# Patient Record
Sex: Female | Born: 1935 | Race: Black or African American | Hispanic: No | Smoking: Never smoker
Health system: Southern US, Community
[De-identification: ages and names within clinical notes are randomized; demographics above are authoritative.]

## PROBLEM LIST (undated history)

## (undated) DIAGNOSIS — K219 Gastro-esophageal reflux disease without esophagitis: Secondary | ICD-10-CM

## (undated) DIAGNOSIS — R569 Unspecified convulsions: Secondary | ICD-10-CM

## (undated) DIAGNOSIS — F419 Anxiety disorder, unspecified: Secondary | ICD-10-CM

## (undated) DIAGNOSIS — I10 Essential (primary) hypertension: Secondary | ICD-10-CM

## (undated) HISTORY — PX: FRACTURE SURGERY: SHX138

## (undated) HISTORY — PX: JOINT REPLACEMENT: SHX530

---

## 2003-02-21 ENCOUNTER — Inpatient Hospital Stay (HOSPITAL_COMMUNITY): Admission: EM | Admit: 2003-02-21 | Discharge: 2003-03-06 | Payer: Self-pay | Admitting: Orthopedic Surgery

## 2003-02-22 ENCOUNTER — Encounter: Payer: Self-pay | Admitting: Orthopedic Surgery

## 2003-02-26 ENCOUNTER — Encounter: Payer: Self-pay | Admitting: Orthopedic Surgery

## 2003-02-27 ENCOUNTER — Encounter: Payer: Self-pay | Admitting: Internal Medicine

## 2003-03-04 ENCOUNTER — Encounter: Payer: Self-pay | Admitting: Orthopedic Surgery

## 2005-03-21 ENCOUNTER — Inpatient Hospital Stay (HOSPITAL_COMMUNITY): Admission: EM | Admit: 2005-03-21 | Discharge: 2005-03-30 | Payer: Self-pay | Admitting: Emergency Medicine

## 2005-03-21 ENCOUNTER — Ambulatory Visit: Payer: Self-pay | Admitting: Physical Medicine & Rehabilitation

## 2006-12-05 ENCOUNTER — Ambulatory Visit: Payer: Self-pay | Admitting: Cardiovascular Disease

## 2006-12-05 ENCOUNTER — Inpatient Hospital Stay (HOSPITAL_COMMUNITY): Admission: EM | Admit: 2006-12-05 | Discharge: 2006-12-16 | Payer: Self-pay | Admitting: Emergency Medicine

## 2006-12-07 ENCOUNTER — Ambulatory Visit: Payer: Self-pay | Admitting: Cardiology

## 2007-01-16 ENCOUNTER — Inpatient Hospital Stay (HOSPITAL_COMMUNITY): Admission: RE | Admit: 2007-01-16 | Discharge: 2007-01-20 | Payer: Self-pay | Admitting: Orthopedic Surgery

## 2010-09-22 NOTE — Op Note (Signed)
Melissa Bradley, Melissa Bradley             ACCOUNT NO.:  [id number]   MEDICAL RECORD NO.:  [id number]          PATIENT TYPE:  INP   LOCATION:  5010                         FACILITY:  [facility]   PHYSICIAN:  Melissa Frankel. Charlann Bradley, M.D.  DATE OF BIRTH:  Jun 12, 1924   DATE OF PROCEDURE:  12/12/2006  DATE OF DISCHARGE:                               OPERATIVE REPORT   PREOPERATIVE DIAGNOSIS:  Failed right total knee replacement.   POSTOPERATIVE DIAGNOSIS:  Failed right total knee replacement.   FINDINGS:  The patient was noted to have a DePuy LPS distal femoral  replacing femoral component that disengaged.  There was also noted to be  a loose cemented femoral stem.   The patient is status post significant reconstruction of her femur from  previously done periprosthetic fracture with significant bony overgrowth  for plate, screws and all hardware.   PROCEDURE:  Revision right total knee replacement, complex.  This was  deemed as a complicated case based on the complexity of the components  used and the situation that presented.   COMPONENTS USED:  I used a DePuy LPS distal femoral replacement  component extra small right with a 125 mm x 13 mm stem, a size 6 cement  restricter, four bags of cement and a 23 mm of LPS hinged insert.   SURGEON:  Melissa Frankel. Charlann Bradley, M.D.   ASSISTANT:  Dwyane Luo, PA-C.   ANESTHESIA:  General.   BLOOD LOSS:  400.   TOURNIQUET TIME:  Tourniquet was up for 90 minutes initially, then down  for approximately an hour, then up again for another 60 minutes at 300  mmHg.   COMPLICATIONS:  None.   DRAINS:  One   INDICATIONS FOR PROCEDURE:  [name] is an 77 year old female who is  known to my practice from treatment of a periprosthetic femur fracture.  She had a history of an LPS revision knee in 2003 with subsequent  periprosthetic fracture in 2004.  I manage this with 90/90 plating.  She  did subsequently heal this fracture.   Over the past 7-10 days, the patient had  developed some pain in her knee  and then noticed a popping type sensation and her knee became very  unstable with inability to bear weight.  She was admitted to the  hospital with my partners.  Under fluoroscopic imaging, I was able to  identify that the component had either fractured or disengaged.  Preparations were made for operation.  I reviewed with her the risks and  benefits including the possible need for amputation above the knee at a  later date given the complexity of  her situation.   PROCEDURE IN DETAIL:  The patient was brought to the operative theater.  Once adequate anesthesia and preoperative antibiotics, 2 grams Ancef,  administered the patient was positioned supine.  Proximal thigh turned  was placed.  The right lower extremity prescrubbed and prepped and  draped in sterile fashion.  Midline incision was made excising her old  scar.  Sharp dissection was carried down to the extensor mechanism,  medium parapatellar arthrotomy was created.  The patient's knee  component was identified.  It was noted at this point that the component  disengaged from itself as opposed to fracturing.   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  I was able to remove the femoral stem without difficulty.  At  this point, attention was now directed to try to figure out the best  plan of action.  I brought fluoroscopy into the field to identify  location of screws.  She had significant bony overgrowth of both the  lateral and anterior plates.   After much thought, even though my primary plan was to place a Press-Fit  component due to fail cementing before, I was very worried that once I  removed bone over top of these plates, trying to remove screws and  cables, that I would have caused the significantly destabilizing  condition of the femur.  Therefore, I opted to cement back into this   condition.   I did spent significant amount of time debriding the intramedullary  canal of all fibrinous material, preparing it as best as possible.  There were several cables that were exposed and wires inside the femoral  canal that I was unable to remove.  This was part of the initial  decision making.   I did place a size 6 cement restricter, a lot of this was done under  fluoroscopic imaging with trials to determine the level and size of the  components to use.  After much use of fluoro and identification, I did  use a size 6 cement restricter, went up basically to the levels of her  previously placed screws.  I then prepared the canal as best as I could  for third generation cement technique with pulse lavage irrigation with  a canal brush and packing the canal with sponges.  This was all after  the tourniquet was re-elevated.  I chose, based on my trial in place, to  use an extra small femoral component as well as the 125 mm stem as  opposed to using any further devices.  Cement was mixed and into a gun  and placed into the canal in retrograde fashion.  It was pressurized as  best as possible.  I then placed the stem component which was impacted  into T[place] position on the back table.  I held into its position,  again checking it fluoroscopically to make sure that it was not in  flexion/extension inside the canal.  Once the cement had cured, I did  several trial reductions and found that a 23 mm poly gave [place] the best  extension and flexion gap balancing.  This was despite there being no  ligaments, it just felt much more stable in these areas in terms of the  tension on the pin.  Given this, the following extra small LPS size 23  mm hinged insert was placed.   At this point, the knee wound was again copiously irrigated with normal  saline solution.  A medium Hemovac drain was placed deep.  I  reapproximated the extensor mechanism with #1 Vicryl, the subcu layer  with 2-0  Vicryl and staples on the skin.  This large skin incision was  then cleaned, dried and dressed sterilely with Xeroform and dressing  sponges, ABDs and a bulky wrap.  She was then extubated and brought to  recovery room in stable condition.      Melissa Bradley,  M.D.  Electronically Signed     MDO/MEDQ  D:  12/12/2006  T:  12/13/2006  Job:  [id number]

## 2010-09-22 NOTE — Op Note (Signed)
Melissa Bradley, Melissa Bradley             ACCOUNT NO.:  [id number]   MEDICAL RECORD NO.:  [id number]          PATIENT TYPE:  INP   LOCATION:  0002                         FACILITY:  [facility]   PHYSICIAN:  Madlyn Frankel. [name], M.D.  DATE OF BIRTH:  [birth year]   DATE OF PROCEDURE:  01/16/2007  DATE OF DISCHARGE:                               OPERATIVE REPORT   PREOPERATIVE DIAGNOSES:  1. End-stage left knee osteoarthritis with severe varus deformity.  2. History of a left distal one third femoral shaft fracture status      post open reduction and internal fixation with intramedullary nail      now two years out.   POSTOPERATIVE DIAGNOSES:  1. End-stage left knee osteoarthritis with severe varus deformity.  2. History of a left distal one third femoral shaft fracture status      post open reduction and internal fixation with intramedullary nail      now two years out.   PROCEDURES:  1. Removal of deep implant including a proximal interlocking screw      under fluoroscopic imaging, then removal of two distal screws and      removal of the nail without complications.  2. Left total knee replacement.  This was a complicated case utilizing      the Vision knee system due to the severe arthritis.   COMPONENTS USED:  DePuy rotating platform posterior stabilized knee  system with size 3 femur, size 3 MBT revision tray with a 13 x 30 cm  cemented stem, medial and lateral 10 mm augment and a 17.5 mm posterior  stabilized rotating platform insert and a 41 patella button.   SURGEON:  Madlyn Frankel. [name], M.D.   ASSISTANT:  Yetta Glassman. Loreta Ave, PA.   ANESTHESIA:  General anesthesia.   BLOOD LOSS:  Minimal.   TOURNIQUET:  A tourniquet was utilized for 110 minutes at 300 mmHg.   DRAIN:  One.   INDICATIONS FOR PROCEDURE:  Ms. Marchetti is an 77 year old female who is  known to me for issues regarding her right knee. I have also helped with  management of a distal left femur fracture.  She had had multiple  right  knee surgeries and had a lot of reluctance at pursuing left primarily  knee replacement.  However, after her most recent right knee procedure,  she had severe pain in the left knee limiting her functional ability.  Based on the persistence of the discomfort, she ultimately decided that  knee replacement was in the best interest.  We discussed the risks and  benefits, even the possible risk of fracture through her old fracture  site requiring further surgery.  After reviewing this case with her and  discussing the risks and benefits, she decided to proceed with surgery  based on her limited ability to bear weight.  Preoperatively she was  noted to have a severe functionally dynamic and mechanical varus  deformity to her left knee.   DESCRIPTION OF PROCEDURE:  Patient was brought into the operating  theater and once adequate anesthesia and preoperative antibiotics were  established, attention was first directed with first  prep at removing  the distal interlocking screw.  Given the proximity of the screw to the  groin, I brought fluoroscopy in and flipped it over the table to utilize  as a lateral view.  I made sure I could identify the screw and then  subsequently prepped and draped this area.  Following this prep and  drape, an incision was made over the screw at the old incision site.  Under fluoroscopic imaging, I subsequently located and identified this  screw.  I was able to attach the locking screwdriver to the screw and  remove it in total without complications.  Final radiograph obtained  confirming removal of screw.  I irrigated this wound, closed the wound  with 2-0 Vicryl and stapled it shut.  This wound was now dressed.  Visceral draping was removed.  Fluoroscopy was removed from the room.   At this point, attention was now directed to the knee.  A proximal thigh  tourniquet was placed nonsterilely.  I then prepped and draped again  following a prescrub of the left  lower extremity.   At this point, the left lower extremity was prepped and draped in  routine fashion.  The old incision was utilized and excised.  Following  excision of the scar and removal of the scar tissue, I developed a soft  tissue plan within the extensor mechanism.  I also made stab incisions  laterally over to the area of the distal interlock.  With that, I had  the knee exposed to a degree through a medial parapatellar arthrotomy.  I could palpate the distal screws laterally and using a stab incision I  opened up this area laterally.  The 6.5 screwdriver was then placed  laterally and inserted it into the screws.  The distal screws were  removed without difficulty, removing in standard fashion.  However, the  most proximal screw was lose and subsequently pulled out of the wound  using a combination of clamps and pliers.   Attention was redirected back to the knee.  With the knee exposed and  the knee flexed, the end of the nail was identified and removed using  the [place] System without difficulty.  Now attention was directed to  the femur.  Patient was noted to have severe degenerative changes in  this knee with advanced arthrosis and osteophytes medially, severely  deformed proximal tibia.  Once the intramedullary rod was passed into  the femur following irrigation, I pinned it in place.  I ended up  resecting just 10 mm which fairly made contact with the distal medial  femur at this point.  I choice not to drop this back further using the  augments at this point.  Following this distal cut, I sized the femur to  be a size 3.  With rotation of the posterior condylar axis, next I  matched that of the perpendicular white side of line and epicondylar  axis.  Impacted the size 3 cutting block and the anterior, posterior and  chamfer cuts then made.  This allowed for further debridement and  further clarification of a normal anatomy with osteophytes removed.  I  chose at this  point not to do my box cut as a potential need for a TC3  component was considered.  Attention was now directed to the tibia.  Proximal and medial tibial peel was carried out. Released the MCL.  The  MCL was noted to be calcified with multiple osteophytes within it as  well as  there was probably portions of the proximal and medial tibia  included in this.  I used the intramedullary guide initially.  At this  time, I determined that the size 3 tibia was going to be suitable.  I  used the step drill followed by irrigating the canal and began hand  reaming up to a size 15 to allow for a 13X30 mm cement stem.  Following  reaming to a 15, I then placed the 10 mm reamer distally to hold the rod  in place.  With the intramedullary in place, I used a 2 degree cutting  block.  I based my cut off the lateral side which was the less effected.  Once I had it pinned in position, I held it in place with the  extramedullary device.  Initially I cut off the top of the block and  checked my extension and felt at this point that the extension gap was  small.  I chose at this point to resect down 4 more mm through the slot.  At that point there was definite enough room for extension.   With this cut surface, the proximal tibial deformity of the proximal  tibia was significantly improved.  Further debridement of the medial  aspect of the proximal tibia removing osteophytes as well as some bone,  it was not required to use any augment medially.   At this point, I went ahead and finished up the femur with a standard  box cut.  This was based off the lateral aspect of the distal femur.  Tunnel reduction was now carried out with a size 3 femur, a 3 tibia  which was keep punched into the center portion of the medial third of  the tubercle.   At this point, I determined that I was going to at least use a 22 mm  poly.  For this reason, I decided that I woulduse revision type  components, I would use a 29  cemented sleeve and  10 mm augments  medially and laterally to help restore the joint line.  All trial  components were removed following cutting the patella, precut  measurements 23 mm.  I resected down to 14 mm and used the 41 patella  button.  The patella did track laterally.  Following removal of  components I did place the cement restricter into the proximal tibia,  size 5.  It was positioned in the appropriate depth.  I then irrigated  the knee out and pulsatile lavaged normal saline solution and injected  the knee with 0.25% Marcaine with epinephrine and 1 mL of Toradol.  At  this point, final components were opened.  Size 3 femur, standard RP  posterior stabilized knee with a size 3 MBT revision tray, 29 mm  cemented sleeve and a 10 mm medial and lateral augments.  This was  prefabricated on the back table and cement was packed into the tibia.  I  then impacted the tibial component in place.   The size 3 femoral component was then cemented  in position.  Initially,  based on trial reduction, I used the 17.5 poly.  The knee came out to  full extension, was very stable and I kept it in extension, extruded  cement was removed.  Once the cement had cured, I removed as much cement  as I could visualize.  The final 17.5 poly was then inserted.  The knee  was very stable from extension and flexion, slight bit of laxity in  the  lateral collateral ligament but significantly improved.  Patella tracked  laterally with application of pressure.  At this point, I irrigated the  knee again and injected 5 mL of FloSeal medial and lateral gutters.   The tourniquet was let down at 110 minutes.  Extensor mechanism was  reapproximated using #1 Vicryl, the knee in flexion.  The remainder of  the wound was closed with 2-0 Vicryl and staples on the skin.  The  lateral stab incision was closed with 2-0 Vicryl and staples.  The wound  at this point was closed, cleaned, dried and dressed sterilely with   Adaptic and a bulky dressing.  Patient was extubated and brought to the  recovery room in stable condition.      Madlyn Frankel [name], M.D.  Electronically Signed     MDO/MEDQ  D:  01/16/2007  T:  01/16/2007  Job:  [id number]

## 2010-09-22 NOTE — Discharge Summary (Signed)
Melissa Bradley, Melissa Bradley             ACCOUNT NO.:  [id number]   MEDICAL RECORD NO.:  [id number]          PATIENT TYPE:  INP   LOCATION:  5010                         FACILITY:  [facility]   PHYSICIAN:  Madlyn Frankel. [name], M.D.  DATE OF BIRTH:  12/26/1924   DATE OF ADMISSION:  12/05/2006  DATE OF DISCHARGE:  12/16/2006                               DISCHARGE SUMMARY   PRINCIPAL DIAGNOSIS:  Failed right total knee revision, with metal  fatigue fracture of her femoral total knee revision component.   SECONDARY DIAGNOSES:  1. Hypertension.  2. History of seizures.  3. Arthritis.   BRIEF HISTORY AND PHYSICAL:  Melissa Bradley is an 77 year old female who  presented from [facility] with a 2-3 day history of inability to bear  weight and significant deformity of the right leg.  She was initially  seen and evaluated in the emergency room.  Based on the description and  her medical issues, we asked for her to be seen and evaluated by the  medical service.  She was admitted to their service.  Upon my  examination later that evening, I found that there was an obvious  problem with the knee.  She was taken to the fluoroscopy suite that  evening, where I identified that there was a failure of the metal  femoral component of the knee.  She was subsequently changed to an  admission to my service with medical consultation for medical clearance.  We subsequently ordered equipment that was necessary and planned for  surgical procedure on Melissa Bradley.  She was subsequently seen and  evaluated by the medical service for medical clearance.   HOSPITAL COURSE:  Following admission and diagnostic tests that were  performed on December 05, 2006, her hospital stay was unremarkable.  She was  taken to the operating room on the evening of December 12, 2006.  She  underwent revision of her right total knee replacement.   She tolerated this procedure without complicating features.  She did get  some preoperative transfusion due  to decreased hematocrit, and  postoperatively did have transfusion for acute perioperative blood loss  anemia.   She was subsequently transferred back to the orthopedic floor.  She was  seen and evaluated by physical therapy, who recommended that she be  transferred to a nursing facility.   [employer], the consulting services, had signed off on December 14, 2006.  They had felt that she was stable enough at that point.  We also  asked for social work evaluation and placement issues since she was from  [place], [place].  With the work of social work and talking to her  family, bed options were presented.   At the time of discharge, her blood level, her hematocrit was 26.5,  following transfusion of blood up to 28.   Her knee wound was dry.  She had been progressing slowly with physical  therapy.  She was eating a regular diet and having normal bowel and  bladder function.   Granted, with some difficulty due to her size and limited mobility with  her surgery.   DISCHARGE DIAGNOSES:  1. Failed right total knee revision.  2. Hypertension.  3. History of seizure disorder.  4. Perioperative acute blood loss anemia.   DISCHARGE MEDICATIONS:   HOME MEDICATIONS:  1. Indapamide 22.5 mg q.morning.  2. Dilantin 100 mg 4 times a day.  3. Phenobarbital 60 mg p.o. at bedtime.  4. Metoprolol 25 mg p.o. b.i.d.   DISCHARGE MEDICATIONS FROM THE HOSPITAL:  1. Lovenox 40 mg subcutaneously daily for 10 days, which is to be      followed by enteric coated aspirin 325 mg p.o. daily for 4 weeks.      Following that, she can resume her naproxen 500 mg p.o. b.i.d. for      arthritis.  2. She will be taking Vicodin 1-2 tablets p.o. q.6 h. p.r.n. for pain.  3. Robaxin 500 mg p.o. q.i.d. p.r.n. for spasms.  4. Colace 100 mg p.o. b.i.d. for constipation while on pain      medications.  5. Iron sulfate 325 mg p.o. t.i.d. for 3 weeks.   DISCHARGE ACTIVITY:  She is weightbearing as tolerated  on the right  lower extremity.  She is to work on knee strengthening and range of  motion activities.  She should use a walker at all times.  I have talked  with Melissa Bradley about the possibility of getting a motorized scooter  due to her size, the knee components that she has in place, given the  failure that she just went through.   DISCHARGE INSTRUCTIONS:  I would like to see her back in the office in 2  weeks.  She should not get her wound wet until her staples are out.  Otherwise, the wound can be cleaned on a regular basis.      Madlyn Frankel [name], M.D.  Electronically Signed     MDO/MEDQ  D:  12/16/2006  T:  12/16/2006  Job:  [id number]

## 2010-09-22 NOTE — Consult Note (Signed)
Melissa Bradley, Melissa Bradley             ACCOUNT NO.:  [id number]   MEDICAL RECORD NO.:  [id number]          PATIENT TYPE:  INP   LOCATION:  5010                         FACILITY:  [facility]   PHYSICIAN:  Peter C. Eden Emms, MD, FACCDATE OF BIRTH:  07/04/1924   DATE OF CONSULTATION:  DATE OF DISCHARGE:                                 CONSULTATION   Ms. Melissa Bradley is an 77 year old patient from [place] we were asked to  see in regard to questioned previous history of subendocardial MI and  need for clearance prior to right orthopedic knee surgery. She is  referred by Dr. [name]   The patient is unaware of any significant heart problems.  Apparently  she had left knee surgery in 2006.  I read through her old chart.  Two  days prior to discharge she had some atypical chest pain.  There were no  EKG changes.  She had a very mild bump in her troponin without bumping  her CPKs.  This was called a non-Q-wave MI.   However, at the time an echocardiogram did not show a wall motion  abnormality.  She was to have outpatient cardiac follow-up but never  did.  I suspect that this was never a subendocardial MI.  In talking to  the patient, she does not get chest pain or significant exertional  dyspnea.  Her activity recently over the last 3 months has been limited  by right knee pain.  She apparently fell a few months ago and dislocated  or loosened her right kneecap.   She was admitted to the hospital for this and weakness.  She is to have  a revision of the right knee soon.   From a cardiac perspective, she has been stable.  She has not had a  recent stress test.  Her coronary risk factors include primarily  hypertension.  There is a history of aortic sclerosis and murmur.   The patient currently is not having chest pain.  There has been no  profound dyspnea, no palpitations, no PND or orthopnea.   Review of systems is otherwise negative.   PAST MEDICAL HISTORY:  1. Hypertension.  2. There is a  question of a seizure disorder and she has been on      chronic Dilantin.  3. There is a history of a murmur with aortic sclerosis.   Her past orthopedic surgery includes a left femoral fracture with an  open reduction and fixation by Dr. [name] in 2006, and she had previous  right total knee replacement in the past with a right hip fracture.   The patient's medications here in the hospital have included:  1. Saline.  2. Lovenox DVT prophylaxis.  3. An aspirin a day.  4. Protonix 40 mg a day.  5. Dilantin 100 mg q.i.d.  6. Phenobarbital 60 mg q.h.s.  7. Naprosyn p.r.n.  8. Potassium 40 mEq b.i.d.  9. Zofran p.r.n.  10.Tylenol p.r.n.   The patient was just written for a blood transfusion for low hemoglobin.  She was also written for Lopressor 12.5 mg b.i.d. by the primary  service.  She is widowed.  She lives with a sister and son-in-law in [place].  Up  until the time she hurt her knee she had done activities of daily  living.  She does not drive any more.  She primarily gets her medical  care at [facility].  She does not drink or smoke.   FAMILY HISTORY:  Noncontributory.   She denies any allergies.   Current exam is remarkable for an elderly black female in no distress.  Blood pressure is 120/70, pulse is 72 and regular, respiratory rate is  14.  She is afebrile.  HEENT:  Normal.  There is no lymphadenopathy, no thyromegaly, no JVP elevation, no  bruits.  LUNGS:  Clear with good diaphragmatic motion and no rales or wheezing.  There is an S1-S2 with a soft systolic ejection murmur.  S2 was clearly  preserved.  There is no aortic insufficiency murmur.  PMI is normal.  ABDOMEN:  Benign.  Bowel sounds are positive.  No hepatosplenomegaly.  No hepatojugular reflux.  No tenderness.  No AAA.  No renal bruits.  Distal pulses are intact with no edema.  Her right leg is in an  immobilizer.  NEUROLOGIC:  Nonfocal.  She does have some weakness in the right leg  muscles,  particularly to extension and flexion as well as abduction.   Her EKG is essentially normal with some left axis deviation.  There is  no evidence of previous MI.  She had a 2 D echocardiogram done here in  the hospital this admission, which showed a normal EF of 60-65% with  aortic valve sclerosis and mild MR.   Her chest x-ray showed cardiomegaly.  This was not confirmed by echo.  Otherwise has no active disease.  She has a PICC line in place in the  right arm.   Her lab work is remarkable for a potassium of 4.0, BUN of 11, creatinine  0.7.  white count of 5.7, hematocrit of 28.4, platelet count is 312.   IMPRESSION:  1. In regard to preop clearance, the patient is cleared.  She does not      need a stress test or further workup.  She has an essentially      normal EKG with a dubious history of subendocardial MI.  She was      not having active chest pain and has good left ventricular function      by echo.  The primary service wrote her for Lopressor 12.5 mg      b.i.d. preoperatively and I think this is fine as long as her blood      pressure and heart rate tolerate it.  2. Question of history of subendocardial myocardial infarction.  I do      not think that this was firmly established.  Her troponins were      mildly elevated and I suspect they may be mildly elevated after      this surgery.  She, unfortunately, did not have a workup on her      last discharge up on [place].  I do not think that she needs a      stress test prior to this surgery.  The fact that her echo did not      show a wall motion abnormality confirms the idea that she never      really did have a myocardial infarction.  3. History of hypertension.  Blood pressure actually a little bit low  here in the hospital.  She is not clear what she might have been on      prior to admission but low-dose Lopressor is fine for now in the      setting of a positive preoperative state.  4. Anemia.  The patient's  iron stores are not low.  She may have an      anemia of chronic disease.  She is being transfused 2 units.  Her      left ventricular function is normal and she should be able to      tolerate this without an issue.  I think she will do better post      knee surgery if her hemoglobin is above 10.  5. History of seizure disorder.  Continue Dilantin and phenobarbital.      Dilantin may need to be switched to IV in the perioperative course,      especially the past 24 hours.  She has not had any active seizures      that I can tell.   Further recommendations will be based on the patient's course, but for  the time being she is cleared for any surgery that needs to be done.      Noralyn Pick. Eden Emms, MD, [facility]  Electronically Signed     PCN/MEDQ  D:  12/09/2006  T:  12/09/2006  Job:  [id number]

## 2010-09-22 NOTE — H&P (Signed)
NAMEJANDA, CARGO             ACCOUNT NO.:  [id number]   MEDICAL RECORD NO.:  [id number]          PATIENT TYPE:  EMS   LOCATION:  MAJO                         FACILITY:  [facility]   PHYSICIAN:  Wilson Singer, M.D.DATE OF BIRTH:  November 19, 1924   DATE OF ADMISSION:  12/05/2006  DATE OF DISCHARGE:                              HISTORY & PHYSICAL   Patient is unassigned.   HISTORY:  This is an 77 year old lady who had a sudden weakness in her  right leg approximately 12 hours ago and she felt the right leg was  given way.  Now it appears to have regained strength and she blames  this weakness in her right knee and right thigh which she has had  surgery and fractures.  She did not have any limb weakness or any speech  problems.   PAST MEDICAL HISTORY:  1. Hypertension.  2. Seizure disorder for which she is on Dilantin.  3. Coronary artery disease with a previous history of possible non-Q-      wave myocardial infarction in November of 2006.  4. History of aortic stenosis.   PAST SURGICAL HISTORY:  1. Left femoral fracture which resulted in open reduction internal      fixation in November of 2006 by Dr. [name].  2. Right total knee replacement in the past and also a right hip      fracture which apparently has had surgery also.   MEDICATIONS:  The patient is unclear about her medications, but  apparently has brought a list with her and I cannot see the list at the  present time.  She knows she is on Dilantin.   ALLERGIES:  None.   SOCIAL HISTORY:  She is widowed and her son lives with her.  She does  not smoke and does no drink alcohol.  She is retired.   FAMILY HISTORY:  Noncontributory.   REVIEW OF SYSTEMS:  Apart from the symptoms mentioned above, there are  no other symptoms in other systems reviewed.   PHYSICAL EXAMINATION:  VITAL SIGNS:  Temperature 97.8, blood pressure  128/79, pulse 65, respiratory rate 12, saturation 99%.  CARDIOVASCULAR:  Heart sounds are present  with an aortic systolic murmur  3/6 heard.  It does not radiate to the carotids.  RESPIRATORY:  Lung fields are clear.  ABDOMEN:  Soft, nontender with no hepatosplenomegaly.  NEUROLOGICAL:  She is alert and orientated and there do not appear to be  any focal neurological signs.  MUSCULOSKELETAL:  There appears to be a deformity in the right knee area  and the right lower leg.  This is probably related to her surgery that  she has had in the past.   INVESTIGATIONS:  Sodium 132, potassium 3.7, BUN 8, creatinine 0.5,  glucose 105.   IMPRESSION:  1. Right leg weakness which is now resolved.  The etiology is unclear.  2. Hypertension.  3. Seizure disorder.  4. History of aortic stenosis.   PLAN:  1. Admit.  2. MRI of brain to rule out any cerebrovascular accident.  3. Echocardiogram to further evaluate aortic murmur.  4. Orthopedic consultation.  The emergency room physicians have      already called Dr. Nilsa Nutting office.  5. Physical and occupational therapy.   Further recommendations will depend on patient's hospital progress.      Wilson Singer, M.D.  Electronically Signed     NCG/MEDQ  D:  12/05/2006  T:  12/05/2006  Job:  [id number]

## 2010-09-22 NOTE — Discharge Summary (Signed)
Melissa Bradley, Melissa Bradley             ACCOUNT NO.:  [id number]   MEDICAL RECORD NO.:  [id number]          PATIENT TYPE:  INP   LOCATION:  1602                         [facility]ACILITY:  [facility]   PHYSICIAN:  Madlyn Frankel. [name], M.D.  DATE OF BIRTH:  [birth year]   DATE OF ADMISSION:  01/16/2007  DATE OF DISCHARGE:  01/20/2007                               DISCHARGE SUMMARY   ADMISSION DIAGNOSES:  1. Osteoarthritis.  2. Status post right total knee replacement.  3. Hypertension.  4. Seizure disorder for which she is on Dilantin.  5. Coronary artery disease.  6. History of aortic stenosis.   DISCHARGE DIAGNOSES:  1. Osteoarthritis.  2. Status post right total knee replacement.  3. Status post left total knee replacement.  4. Hypertension.  5. History of seizure disorder.  6. History of aortic stenosis.  7. Coronary artery disease.  8. Acute blood loss anemia.   CONSULTATIONS:  None.   OPERATION/PROCEDURE:  Left total knee replacement.  Surgeon:  Madlyn Frankel.  [name], M.D.  Assistant:  Yetta Glassman. Mann, PA.   LABORATORY DATA:  CBC showed hematocrit 34.1. She did have some  transfusion to replace her blood and at discharge her hematocrit was  stable at 29.7.  Coagulation was within normal limits.  Chemistries on  admission all within normal limits.  She did have a slight bump in  glucose up to 120 and at discharge all within normal limits.  GI work-  up:  She had an albumin of 3.2.  UA on admission showed positive UTI.  Radiology:  Chest, 2 views, showed cardiac enlargement with vascular  congestion.  Cardiology:  EKG showed normal sinus rhythm with a left  axis deviation.   BRIEF HISTORY OF PRESENT ILLNESS:  Melissa Bradley is a very pleasant 77-  year-old female with significant left knee pain after previous revision  of the right knee.  Was unable to undergo any kind of physical therapy  or progress due to severe arthritis with significant deformity and was  refractory to all conservative  treatment and due to her inability to  ambulate,  was cleared for left total knee replacement.   HOSPITAL COURSE:  The patient underwent left total knee replacement and  tolerated the procedure well.  She was admitted to the orthopedic floor.  The pain is well controlled.  She remained afebrile throughout.  She  remained neurovascularly intact in the left lower extremity throughout  the course of stay.  Prophylaxis for DVT was started on postoperative  day #1 which included Lovenox.  She was ambulated early.  Her physical  therapy progress was slow.  She was able to tolerate it.  She was able  to do a straight leg raise as soon as day #2.  On day #4 she was stable,  afebrile and ready for discharge.   DISPOSITION:  Discharge to skilled nursing facility, stable and improved  condition, weightbearing as tolerated.   DISCHARGE PHYSICAL THERAPY:  Goals of physical therapy were gait  training, minimize pain, maximize strength, increase range of motion,  and encourage independence of activities of daily living.  WOUND CARE:  Keep the wound dry.  Change dressing on a daily basis.   FOLLOW UP:  Follow up with Dr. [name] in two weeks.   DISCHARGE MEDICATIONS:  1. Dilantin 100 mg p.o. four times a day.  2. Phenobarbital 60 mg p.o. at bedtime.  3. Lopressor 25 mg p.o. twice a day.  4. Lozol 2.5 mg p.o. q.a.m.  5. Lovenox 30 mg subcutaneously q.12h. for 11 days.  6. Robaxin 100 mg p.o. q.6h. muscle spasm.  7. Iron sulfate 325 mg one p.o. t.i.d. for two weeks.  8. Aspirin 325 mg one p.o. daily after Lovenox completed.  9. Colace 100 mg p.o. b.i.d. constipation.  10.Vicodin 5/325 mg one to two p.o. q.4 h. p.r.n. pain.     ______________________________  Yetta Glassman. [address], [place]      Madlyn Frankel. [name], M.D.  Electronically Signed    BLM/MEDQ  D:  01/20/2007  T:  01/20/2007  Job:  [id number]

## 2010-09-25 NOTE — Op Note (Signed)
Melissa Bradley, Melissa Bradley             ACCOUNT NO.:  [id number]   MEDICAL RECORD NO.:  [id number]          PATIENT TYPE:  INP   LOCATION:  5008                         FACILITY:  [facility]   PHYSICIAN:  Madlyn Frankel. [name], M.D.  DATE OF BIRTH:  1925-04-13   DATE OF PROCEDURE:  03/23/2005  DATE OF DISCHARGE:                                 OPERATIVE REPORT   PREOPERATIVE DIAGNOSIS:  Closed left distal third femoral diaphyseal  fracture.   POSTOPERATIVE DIAGNOSIS:  Closed left distal third femoral diaphyseal  fracture.   OPERATION PERFORMED:  Open reduction internal fixation of left femur shaft  fracture utilized intramedullary nail.   SURGEON:  Madlyn Frankel. [name], M.D.   ASSISTANT:  Surgical tec.   ANESTHESIA:  General.   ESTIMATED BLOOD LOSS:  250 mL.   COMPLICATIONS:  None.   DRAINS:  One.   INDICATIONS FOR PROCEDURE:  Ms. Radich is a 77 year old female known to my  practice from previous work on her right lower extremity.  She is from  [place], [place] and had a one month history of some left thigh pain and  suddenly around the 12th, she had immediate onset of discomfort, radiographs  revealed a midshaft femur fracture.  She was transferred to [facility] based on  our previous interactions.  She was initially seen and evaluated by one of  my partners, Vania Rea. Supple, M.D., who admitted her.  She does have a  history of severe osteoarthritis, but upon review of her current situation,  it was felt that the best course of action for her was to fix the fracture  and address her knee at a later date if she wished to pursue that.  Risks  and benefits were reviewed with treatment options.  Based on her knee  osteoarthritis and the size of the patient, retrograde nail was chosen.  Consent was obtained based on this discussion.   DESCRIPTION OF PROCEDURE:  The patient was brought to the operating theater.  The patient was positioned supine onto a flat Jackson table with bolsters  applied to  the right lateral aspect of the hip to prevent the patient from  rolling.  A left hip bump was then placed.   X-ray was brought to the field to confirm the location of the knee.  A  midline incision was carried from the superior portion of the patella to the  tibial tubercle location.  Sharp dissection was carried down to identify the  extension mechanism.  An incision was made along the medial aspect of the  patellar tendon.  At this point I did expose the intra-articular portion of  the knee which had severe osteophytes confirmed by the radiographs.  The  osteophytes originated off the proximal tibia and made it difficult to even  allow for visualization of the femoral notch.  Utilizing an osteotome and  rongeur, these osteophytes were removed off the proximal tibia.  Upon  removal of these osteophytes, the knee exposure was obtained.  Guidewire was  positioned per protocol into the center portion of the knee in the AP plane  and then about 1 cm  above the PCL insertion and the lateral plane.  It was  passed into the center of the canal.  I then passed a 13 mm drill into this  distal femur opening up the canal.  At this point utilizing a reduction  tool, the fracture was reduced and a guidewire passed across it.  With the  fracture reduced and reamed with a 13 then a 14 mm reamer, got good chatter.  Based on the location of the fracture, I went ahead and chose a 13 mm nail.  We measured it to be a 38 cm nail.  When the final nail was brought to the  field and then passed by hand across the fracture site and into its final  location using radiographs for confirmation.  With the nail in its final  position, the fracture reduced nicely.  At this point using the distal jig,  two distal interlocks were placed in the static location.  I then used a  single static interlock positioned in a perfect circle technique in the  proximal aspect of the thigh.  No complicating features in this  procedure.   Final radiographs were obtained and the wounds were all irrigated with  normal saline solution.  I did utilized the medium Hemovac drain in the  arthrotomy based on the osteophyte excision.  The smaller stab incisions  were reapproximated using 2-0 Vicryl and staples.  The median arthrotomy was  reapproximated using #1 Vicryl, 0 Vicryl in the subcutaneous fat, 2-0 Vicryl  subcu and staples on the skin.  The wounds were all cleaned, dried and  dressed sterilely with Adaptic dressing sponges and tape.  The patient was  extubated and taken to the recovery room in stable condition.      Madlyn Frankel [name], M.D.  Electronically Signed     MDO/MEDQ  D:  03/23/2005  T:  03/24/2005  Job:  [id number]

## 2010-09-25 NOTE — Discharge Summary (Signed)
Melissa Bradley, Melissa Bradley                         ACCOUNT NO.:  [id number]   MEDICAL RECORD NO.:  [id number]                   PATIENT TYPE:  INP   LOCATION:  0457                                 FACILITY:  [facility]   PHYSICIAN:  Madlyn Frankel. [name], M.D.               DATE OF BIRTH:  Dec 18, 1924   DATE OF ADMISSION:  02/21/2003  DATE OF DISCHARGE:                                 DISCHARGE SUMMARY   ADMITTING DIAGNOSES:  1. Paraprosthetic right femoral fracture.  2. Seizure disorder.  3. Hypertension.  4. Coronary artery disease.  5. Aortic stenosis.  6. Osteoarthritis.  7. Mild anemia.   DISCHARGE DIAGNOSES:  1. Paraprosthetic right femoral fracture.  2. Seizure disorder.  3. Hypertension.  4. Coronary artery disease.  5. Aortic stenosis.  6. Osteoarthritis.  7. Postoperative anemia.  8. Urinary tract infection (Escherichia coli) treated with Cipro.   OPERATION:  On February 26, 2003 the patient underwent open reduction  internal fixation right paraprosthetic femoral fracture utilizing two  [employer] plates, four Dall-Miles cables, tibial strut allograft,  with methylmethacrylate cement.  [facility] assisted.   CONSULTS:  Dr. Elliot Gurney, et. al. of [facility].   BRIEF HISTORY:  This 77 year old lady with post total knee replacement  arthroplasty to the right femur was transferred to [facility] for further  evaluation and definitive care from the hospital in [place].  She fractured  this femur February 20, 2003.  X-rays showed fracture of the femur above the  stem of the DePuy LPS distal femoral replacement prosthesis.  After medical  clearance by Dr. Jamie Brookes, et. al. the patient was taken to surgery for the  above procedure.   COURSE IN THE HOSPITAL:  The patient tolerated the surgical procedure quite  well.  It was noted that she had a urinary tract infection and was running a  temperature; E. coli was the organism.  She was treated with IV  medications  - Cipro specifically - and then to Cipro p.o.  She had a postoperative  anemia.  We attempted to transfuse her through a regular IV line.  She  received 1 unit which brought her hemoglobin up to 10.8 from 9.8.  Unfortunately, they could only transfuse a little bit of the second unit as  her IV line leaked and they had to discontinue it.  We chose not to do a  PICC line as her hemoglobin stabilized in the high to mid 8's.  Final  hemoglobin was 8.3 with hematocrit of 24.9.  She was on iron throughout her  hospitalization.   The patient had no seizure episode while in the hospital.   We had her work diligently with physical therapy.  At first she was quite  frightened due to the pain in the right femur but this lessened.  Pain  controlled was maintained primarily on Darvocet.  She worked diligently with  therapy.  They were able to get her moving a little better with a large  walker.  She continued with her fear of falling, however,  She maintained  the majority of her weight on her left lower extremity with touchdown  weightbearing of the right lower extremity.  She did have one episode where  she slid to the floor early on while therapy was trying to get her out of  bed.  She did not fall; she merely slid to the floor.  We x-rayed her right  femur at the ORIF area and the radiologist stated that the fragments were in  excellent position with the internal fixation holding it securely.   Neurovascular remained intact in the right lower extremity.  She continued  on DVT prophylaxis.  We thought that she might be a candidate for inpatient  rehab at [facility].  However, due to her slow level of activity it is felt that  skilled nursing might be indicated.  The case workers worked diligently with  the family.  They decided that [facility] skilled nursing here in [place]  would be the best choice.  I checked with [facility] Mr.  Everett Graff, and notified him that we  would be transferring her there.  He along with Dr. Jamie Brookes, et. al. will be following her.  They will watch  closely her hemoglobin and if necessary, transfuse if it gets below 8.  This  is per Dr. Gailen Shelter recommendation.   On the day of discharge neurovascular is intact to the right lower  extremity.  The patient was cheerful.  She was flexing the knee to about 30  degrees.  Internal-external rotation at the hip was performed and the femur  moved as a unit with minimal discomfort.  There was minimal to no draining  to the right hip/thigh wound.   The patient was quite pleased that she could continue with her  rehabilitation and was looking forward to ambulating again.   LABORATORY DATA:  Laboratory values in the hospital showed a preoperative  CBC on February 21, 2003 with an rbc count of 3.8, hemoglobin 11.5,  hematocrit was 34.5.  Final hemoglobin as mentioned above was 8.3 with  hematocrit of 24.9.  The rbc was 2.86.  White count was normal.  (During her  treatment of urinary tract infection her white count got as high as 18.7.)  Blood chemistries were all normal.  Cardiac markers:  CK was 230.  Phenobarbital level was less than 5.0.  Urinalysis on October 14 showed many  bacteria positive for urinary tract infection which grew out E. coli.  This  was treated with ciprofloxin.  Blood cultures were negative.  The cultures  of the right femur at surgery:  Gram smear showed no white count, no wbc's,  no organisms, and no squamous epithelial cells.  No growth from the cultures  of the right femur at surgery.  Blood cultures which were done also on  October 20 showed no growth x5 days.  The lower extremity ultrasound showed  no evidence of DVT.  Chest x-ray showed cardiomegaly without acute  abnormality.  Knee aspiration done under fluoroscopy also showed no growth.  Electrocardiogram:  Normal sinus rhythm.   CONDITION ON DISCHARGE:  Improved/stable.  PLAN:  When confirmation is  received, the patient will be transferred to  [facility] in [place].  I discussed with Mr. Lenny Pastel  with [facility] her transfer.  He and his associates will follow  her at [place].  We will watch very closely her hemoglobin and  hematocrit.  If it drops below 8 they plan to transfuse.   She may continue with physical therapy, touchdown weightbearing in the right  lower extremity, weightbearing as tolerated in the left lower extremity.  Would recommend in-bed muscle strengthening, range of motion of right hip  and knee as tolerated actively and passively.  May continue with PAS  stockings as needed; if not, then go to TED hose if can be fitted.   Dry dressing to the right hip daily.  Staples may be removed at two-and-a-  half to three weeks postoperatively and Steri-Strips applied p.r.n.  Would  recommend return to see Dr. [name] in his office at [facility]  [phone]) two-and-a-half to three weeks after the date of surgery.   DISCHARGE MEDICATIONS:  1. Phenobarbital 30 mg one p.o. q.12h.  2. Dilantin 50 mg Infatab p.o. q.8h.  3. Norvasc 2.5 mg p.o. daily.  4. Protonix 40 mg EC tablet one daily.  5. Os-Cal + D one b.i.d.  6. Trinsicon iron supplement one t.i.d.  7. Ciprofloxin 500 mg one q.8h. p.o.  8. Reglan 10 mg p.o. q.6h. p.r.n.  9. Hydrocodone one to two q.4-6h. p.r.n. severe pain.  10.      Darovcet-N 100 one to two q.4-6h. p.r.n. mild pain.  11.      Robaxin 500 mg p.o. q.6h. p.r.n. muscle spasm.  12.      Tylenol p.r.n. for temperature over 100.   Continue with incentive spirometer five minutes each hour 7 a.m. to 10 p.m.  Would recommend hematocrit and hemoglobin while at [facility] to be  done on Monday, Wednesday, and Friday with reports going to Mr. Lenny Pastel.  Any medical questions or any change in the patient's medical  status should be reported to the physicians and practitioners at [facility].     Dooley L. Cherlynn June.                 Madlyn Frankel [name], M.D.    DLU/MEDQ  D:  03/06/2003  T:  03/06/2003  Job:  [id number]   cc:   Rosanne Sack, M.D.  [address]  [place], [place] [address]  Fax: [phone]   Lia Hopping  [address]  [place]  [place] [address]  Fax: [phone]

## 2010-09-25 NOTE — Discharge Summary (Signed)
NAMEHAGAR, Melissa             ACCOUNT NO.:  [id number]   MEDICAL RECORD NO.:  [id number]          PATIENT TYPE:  INP   LOCATION:  5008                         FACILITY:  [facility]   PHYSICIAN:  Madlyn Frankel. [name], M.D.  DATE OF BIRTH:  Oct 19, 1924   DATE OF ADMISSION:  03/21/2005  DATE OF DISCHARGE:                                 DISCHARGE SUMMARY   ADMITTING DIAGNOSIS:  Closed left femoral diaphyseal fracture.   The patient is a 77 year old female known to Dr. Nilsa Nutting practice from  [place], with a one-month history of left thigh pain, sudden onset  around February 18, 2005.  X-rays showed mid-shaft femur fracture.  She was  initially evaluated by Vania Rea. Supple, M.D. on admission.  History of  severe osteoarthritis.   PAST MEDICAL HISTORY:  Hypertension and seizure disorder.   HOSPITAL COURSE:  Surgery was performed by Madlyn Frankel. [name], M.D. on March 23, 2005, open reduction and internal fixation of left femoral shaft,  intramedullary nail.  The patient was stable postoperatively.  On first  postoperative day, hemoglobin was 11.1, hematocrit 33.2, sodium slightly low  at 132, otherwise neurovascularly motor intact bilateral lower extremities  distally.  Hemovac was discontinued on postoperative day #1.  The patient is  weightbearing as tolerates.  Physical therapy consult.  On postoperative day  #2, the patient stable with T-max of 100.8.  Discharge planning was started.  Postoperatively, she was placed on Coumadin which is managed by pharmacy.  Cardiology consult was ordered on March 27, 2005.  Impression was non-Q  wave MI secondary to coronary artery disease, no history of primary cardiac  evaluation.  It was recommended that the patient is stable, to take aspirin  daily, beta blocker therapy, outpatient cardiology consult for evaluation of  coronary artery disease, okay with them stable to discharge.   MEDICATIONS UPON DISCHARGE:  1.  Phenobarbital 30 mg p.o.  q.h.s..  2.  Phenytoin (Dilantin) 100 mg p.o. t.i.d.  3.  Indapamide 2.5 mg p.o. daily.  4.  Restoril 15 mg p.o. q.h.s. p.r.n. for sleep.  5.  Reglan 10 mg q.6 p.r.n. for nausea.  6.  Lopressor 25 mg p.o. b.i.d.  7.  Coumadin current dose of 7.5 mg p.o. daily to be adjusted per medical      doctor.  8.  Robaxin 500 mg q.8 p.r.n. for muscle spasm.  9.  Vicodin 5/500 1-2 tablets q.4-6 p.r.n. for pain.   DISCHARGE DIAGNOSES:  1.  Left intramedullary nail femur.  2.  Hypertension.  3.  Seizure disorder.  4.  Coronary artery disease.   STATUS:  Stable.   DIET:  As tolerates.   FOLLOW UP:  Two weeks postoperative with Madlyn Frankel. [name], M.D.  Weightbearing as tolerated. PT/OT.  Transfers, gait training, ADLs.  Follow  up with cardiology on an outpatient basis for evaluation for coronary artery  disease.   PREOPERATIVE LABORATORY STUDIES:  White blood count 9.8, red blood count  3.8, hemoglobin 11.5, hematocrit 34.5, platelets 172,000.  PT was 13.8, INR  of 1. Sodium 137, potassium 2.6, chloride 105, CO2 23, glucose 169,  BUN 15,  creatinine 0.6.  Calcium 8.5, albumin 2.9.  An EKG is normal sinus rhythm,  left axis deviation, abnormal ECG, unconfirmed at the time of admission.      Lianne Cure, P.A.      Madlyn Frankel [name], M.D.  Electronically Signed    MC/MEDQ  D:  03/29/2005  T:  03/29/2005  Job:  [id number]

## 2010-09-25 NOTE — H&P (Signed)
Melissa Bradley, THOM                         ACCOUNT NO.:  [id number]   MEDICAL RECORD NO.:  [id number]                   PATIENT TYPE:  INP   LOCATION:  0457                                 [facility]ACILITY[facility]  [facility]   PHYSICIAN:  Madlyn Frankel. [name], M.D.               DATE OF BIRTH:  1925/01/02   DATE OF ADMISSION:  02/21/2003  DATE OF DISCHARGE:                                HISTORY & PHYSICAL   CHIEF COMPLAINT:  Pain in my right knee.   HISTORY OF PRESENT ILLNESS:  The patient is a 77 year old female who has had  two weeks of increased pain in her right knee and thigh. She has undergone a  right total knee arthroplasty in 1994 by Dr. Arletha Grippe and a revision total  knee arthroplasty on the right in 2001 by Dr. Diona Fanti at [facility]. She was  admitted to [facility] for the increasing amount of pain in her  right knee. X-rays reveal that she had a periprosthetic right femur fracture  for which she was then transferred to [facility] to be under the  care of Dr. Durene Romans. Dr. [name] feels that it is best that this patient  undergo surgical intervention. The risks and benefits of the surgery have  been discussed with the patient and the patient wishes to proceed.   PAST MEDICAL HISTORY:  1. Seizure disorder.  2. Hypertension.  3. Coronary artery disease.  4. Aortic stenosis.  5. Osteoarthritis.   PAST SURGICAL HISTORY:  1. Right total knee in 1994.  2. Right total knee revision in 2001.   MEDICATIONS:  1. Phenobarbital 30 mg one p.o. every 12 hours.  2. Dilantin 100 mg one p.o. every 8 hours.  3. Adalat 30 mg one p.o. daily.   ALLERGIES:  No known drug allergies.   SOCIAL HISTORY:  The patient is a widow. She denies any tobacco or alcohol  use.   FAMILY HISTORY:  Noncontributory.   REVIEW OF SYMPTOMS:  GENERAL:  Denies fever, chills or night sweats,  bleeding tendencies. CNS:  Denies blurred or double vision, seizures,  headaches, paralysis. RESPIRATORY:   Denies shortness of breath, productive  cough, hemoptysis. CARDIOVASCULAR:  She denies chest pain or orthopnea. GI:  Denies nausea, vomiting, diarrhea, constipation, melena or bloody stools.  GU:  Denies __________ or discharge. MUSCULOSKELETAL:  As pertinent to the  HPI.   PHYSICAL EXAMINATION:  VITAL SIGNS:  Temperature 98.6, pulse 73,  respirations 18, blood pressure 111/72.  GENERAL:  A well-developed, well-nourished, obese, 77 year old female.  NECK:  Supple, no carotid bruits.  CHEST:  Clear to auscultation bilaterally. No wheezes or crackles.  HEART:  Regular rate and rhythm, no murmurs, rubs or gallops.  ABDOMEN:  Soft, nontender, nondistended, positive bowel sounds x4.  EXTREMITIES:  Right knee swollen, patient is unable to fully extend right  knee. She is neurovascularly intact distally to the right lower extremity.  She  has decreased range of motion of the right knee due to pain.  SKIN:  No rashes or lesions.  NEUROLOGIC:  Alert and oriented x3.   X-ray reveals periprosthetic right femur fracture.   IMPRESSION:  1. Periprosthetic right femur fracture.  2. Seizure disorder.  3. Hypertension.  4. Coronary artery disease.  5. Aortic stenosis.  6. Osteoarthritis.   PLAN:  The patient is to be admitted to the [facility] under the  care of Dr. Durene Romans for repair of periprosthetic right femur fracture.     Clarene Reamer, P.A.-C.                   Madlyn Frankel [name], M.D.    SW/MEDQ  D:  02/22/2003  T:  02/22/2003  Job:  [id number]

## 2010-09-25 NOTE — Consult Note (Signed)
NAMESOPHIE, TAMEZ             ACCOUNT NO.:  [id number]   MEDICAL RECORD NO.:  [id number]          PATIENT TYPE:  INP   LOCATION:  5008                         FACILITY:  [facility]   PHYSICIAN:  Danae Chen, M.D.DATE OF BIRTH:  [birth year]   DATE OF CONSULTATION:  03/27/2005  DATE OF DISCHARGE:                                   CONSULTATION   REFERRING PHYSICIAN:  Madlyn Frankel. [name], M.D.   REASON FOR CONSULTATION:  Chest pain and elevated cardiac enzymes in a 77-  year-old [place] American female, status post open reduction, internal  fixation of left femur fracture.   BRIEF HISTORY:  The patient is a pleasant 77 year old African American  female who was admitted by the orthopedic service on March 23, 2005, for  evaluation of closed left distal third femoral fracture. She underwent  surgery and had been doing well postoperatively until the day of discharge  when she experienced some left-sided chest discomfort. She had been slightly  diaphoretic, but denied any nausea or vomiting at that time. She had some  slight shortness of breath, but did not have any overt signs of dyspnea. The  chest discomfort was substernal in nature. It did radiate somewhat to her  left arm, but not to her back or any other area. The discomfort lasted  approximately 5 to 10 minutes and then dissipated. She reports having had on  prior history of similar episodes in the past. She has no known history of  coronary artery disease, according to her knowledge, although prior records  from admission in 2004 per the orthopedic service does list in her past  history coronary artery disease and hypertension. She, to her knowledge, has  not had any cardiac evaluation either with cardiac catheterization or  otherwise. She is a nonsmoker. She does not know her cholesterol level. She  is not a diabetic. She has no history of heart disease in her family that  she is aware of.   PAST MEDICAL HISTORY:  1.   Significant for osteoarthritis, bilateral lower extremity surgeries per      ortho.  2.  History of hypertension.  3.  History of aortic stenosis.  4.  Seizure disorder per her prior chart.   PAST SURGICAL HISTORY:  Right total knee in 1994 and a total knee revision  in 2001.   FAMILY HISTORY:  As above. No history of heart disease that she is aware of.   SOCIAL HISTORY:  She is a widow, lives in [place], [place]. No tobacco or  alcohol use.   REVIEW OF SYSTEMS:  Per the brief history as above.   PHYSICAL EXAMINATION:  GENERAL: She is in no acute distress, sitting in the  chair by the bedside.  VITAL SIGNS: Temperature 97.7, pulse 78, blood pressure 114/75, O2  saturation 99% on room air.  HEENT: Oropharynx is clear.  NECK: Thick, but supple. No lymphadenopathy is noted. No JVD is noted.  Pupils are equal and reactive.  LUNGS: Clear to auscultation bilaterally both anteriorly and posteriorly.  HEART: Heart rate is regular normal S1 and S2. She has a slight grade  1 to 2  over 6 systolic ejection murmur.  ABDOMEN: Obese, but soft, nontender with active bowel sounds. She has no  costovertebral angle tenderness. She has no chest wall tenderness to  palpation. 2+ femoral pulses. 2+ dorsalis pedis pulses.  EXTREMITIES: Left lower extremity with dressing in place. No peripheral  edema is noted. She has 5/5 strength in the upper extremities. She moves all  four extremities.  NEUROLOGIC: She is alert and oriented.   EKG shows left axis deviation with normal sinus rhythm, no evidence of ST  segment depression or elevation.   PERTINENT LABORATORIES:  INR 1.7. Sodium 132, potassium 3.4, chloride 94,  CO2 32, glucose 124, BUN 5, creatinine 0.5. Cardiac markers reveal troponin  0.40, 0.29, 0.21, and the most recent is 0.13. It is trending downward.  CK-  MB is within normal range.  Chest x-ray from November 12th shows cardiac  enlargement and chronic-appearing lung changes, but no acute  findings.   IMPRESSION:  Non-Q-wave myocardial infarction probably secondary to stress  of surgery. The patient also has probable ischemic heart disease with  coronary artery disease, but has no recollection of prior cardiac  evaluation.   RECOMMENDATIONS AT THIS TIME:  1.  Check a 2-D echocardiogram, thyroid stimulating hormone level, BNP, and      a cholesterol panel.  2.  Initiate aspirin and beta blocker therapy.  3.  Consider an outpatient cardiology consult for evaluation of coronary      artery disease.  4.  If the patient remains pain-free and is stable from an orthopedic      standpoint she can go to rehabilitation and I will follow up as an      outpatient as noted for risk stratification for coronary artery disease.      However, if she has continued chest pain while in the hospital or if her      echocardiogram or other lab values are abnormal then she should get a      cardiac consultation while here in the hospital.      Danae Chen, M.D.  Electronically Signed     RLK/MEDQ  D:  03/27/2005  T:  03/28/2005  Job:  [id number]

## 2010-09-25 NOTE — H&P (Signed)
Melissa Bradley, Melissa Bradley             ACCOUNT NO.:  [id number]   MEDICAL RECORD NO.:  [id number]          PATIENT TYPE:  INP   LOCATION:  1602                         [facility]ACILITY[facility]   PHYSICIAN:  Melissa Frankel. Charlann Bradley, M.D.  DATE OF BIRTH:  [birth year]   DATE OF ADMISSION:  01/16/2007  DATE OF DISCHARGE:  01/20/2007                              HISTORY & PHYSICAL   PROCEDURE:  Left total knee arthroplasty.   CHIEF COMPLAINT:  Left knee pain.   HISTORY OF PRESENT ILLNESS:  Ms. Melissa Bradley is an 77 year old female with  significant left knee pain with previous revision right knee surgery.  She has undergone physical therapy and her progress has been very  limited by the severe arthritis in her left knee.  It has been  refractory to all conservative treatments and has just decreased her  ability to ambulate.  She has been presurgically cleared for left total  knee replacement.   PAST MEDICAL HISTORY:  1. Osteoarthritis.  2. Status post right total knee replacement.  3. Hypertension.  4. Seizure disorder for which she is on Dilantin  5. Coronary artery disease with previous history of possible non-Q-      wave myocardial infarction November 2006.  6. History of aortic stenosis.   PAST SURGICAL HISTORY:  1. Left femoral fracture with ORIF in November 2006 by Dr. Charlann Bradley.  2. Right total knee replacement in the past and also right hip      fracture requiring surgery as well as revision right total knee      replacement.   SOCIAL HISTORY:  Noncontributory.   ALLERGIES:  NO KNOWN DRUG ALLERGIES.   FAMILY HISTORY:  Noncontributory.   REVIEW OF SYSTEMS:  See HPI.   PHYSICAL EXAMINATION:  VITAL SIGNS:  Pulse 52, respirations 18, blood  pressure 133/73.  GENERAL:  Awake, alert and oriented, well-developed, well-nourished in  no acute distress.  NECK:  Supple.  No carotid bruits.  CHEST/LUNGS:  Clear to auscultation bilaterally.  BREASTS:  Deferred.  CARDIOVASCULAR:  S1-S2 distinct with  systolic murmur.  ABDOMEN:  Soft, nontender, nondistended.  Bowel sounds present.  GENITOURINARY:  Deferred.  EXTREMITIES:  Left lower extremity painful to range of motion.  Right  knee surgical scar intact.  Tolerates range of motion right knee.  SKIN:  No cellulitis.  NEUROLOGICAL:  Intact distal sensibilities.   LABORATORY DATA:  Labs, EKG, chest x-ray pending.   IMPRESSION:  1. Osteoarthritis.  2. Status post right total knee replacement.  3. Hypertension.  4. Seizure disorder.  5. Coronary artery disease.  6. History of aortic stenosis.   PLAN:  Left total knee arthroplasty by surgeon Dr. Durene Romans.  Risks  and complications were discussed.     ______________________________  Melissa Bradley, [place]      Melissa Frankel. Charlann Bradley, M.D.  Electronically Signed    BLM/MEDQ  D:  02/07/2007  T:  02/07/2007  Job:  [id number]

## 2010-09-25 NOTE — Op Note (Signed)
Melissa Bradley, Melissa Bradley                         ACCOUNT NO.:  [id number]   MEDICAL RECORD NO.:  [id number]                   PATIENT TYPE:  INP   LOCATION:  0457                                 FACILITY:  [facility]   PHYSICIAN:  Madlyn Frankel. [name], M.D.               DATE OF BIRTH:  [birth year]   DATE OF PROCEDURE:  02/26/2003  DATE OF DISCHARGE:                                 OPERATIVE REPORT   PREOPERATIVE DIAGNOSIS:  Right periprosthetic femur fracture mid shaft.   POSTOPERATIVE DIAGNOSIS:  Right periprosthetic femur fracture mid shaft.   PROCEDURE:  Open reduction and internal fixation of right periprosthetic  femur fracture using two [employer]on[employer] plates one 10 inch and one 8  inch. The 10 inch plate placed laterally, the 8 inch plate placed anterior.  The four bicortical screws were placed proximal to the fracture in the 10  inch plate, three bicortical screws in the 8 inch plate. Distal to the  fracture site, four Dall-Miles cables were wrapped around the two plates. A  5 inch segment of tibial strut allograft was applied to the anterolateral  aspect of the construct due to a defect. One bag of methyl methacrylate  cement was packed into the proximal portion of the femoral stem.   SURGEON:  Oley Balm. [name], M.D.   ASSISTANT:  Elliot Gurney, P.A.   ANESTHESIA:  General.   DRAINS:  Drains x1.   IV FLUIDS AND BLOOD LOSS:  Per anesthesia records. IV fluids included 3  units of packed red blood cells for a starting hematocrit of 30.   INDICATIONS FOR PROCEDURE:  Ms. Gergen is a 77 year old female who  sustained a periprosthetic femur fracture on or about February 20, 2003 at  her home in [place], [place]. She was transferred to [facility] for evaluation and definitive treatment on Thursday the 14th. After  trying to obtained medical records from her previous surgical procedure,  this was accomplished on Monday the 18th. The patient had had a previous  revision total  knee replacement with a DePuy LPS distal femoral replacement  prosthesis with a 150 mm cemented stem. The patient had fractured just above  this stem.   Preoperatively, the patient was ruled out for septic arthritis of the joint  with aspiration which was negative growth in three days. She also had an  ultrasound of her bilateral lower extremities to rule out DVT secondary to  her prolonged bed rest. After discussing all surgical options including  revision knee replacement or open reduction and internal fixation versus the  combination of those two versus amputation at length with the patient and  one of her family members, she consents for open reduction and internal  fixation with possible revision stem versus amputation if occult infection  was present.   DESCRIPTION OF PROCEDURE:  The patient was brought to the operative theatre.  Once adequate anesthesia and preoperative antibiotics,  1 g of Ancef, were  administered, the patient was positioned supine on the operating table.  Rabbit ear posts were used to stabilize the patient on the bed. The right  lower extremity was then prepped and draped in a sterile fashion. A mid  lateral incision was made basically from about 10 cm proximal to the knee  joint extending up the femur for a total length of about 12 inches. Sharp  dissection was carried down to the iliotibial band. The iliotibial band was  split at or near the lateral intermuscular septum. The vastus lateralis was  then elevated off the intermuscular septum with care to identify perforating  vessels. The fracture site was identified and debrided. At this point, the  tension was directed to the distal fragment, the segment that had the  femoral stem in place. Cement fragments were debrided from the wound as well  as at the proximal extent of the stem. This stem was not loose within the  femoral shaft, this could be ascertained with manual force in the OR. It  moved the entire  unit including the visible prosthetic component distally.  There was a significant gap left in the proximal portion of the stem and it  was decided to pack this with some cement to provide some longer term  stability to the stem. One bag of cement was prepared in a loose state. It  was packed around the prosthesis. Once it had cured, excessive cement was  removed. At this point, the femoral stem was reduced into the distal femoral  canal following removal of the previously placed cement plug. Through an  approximately 4-5 cm large anterior lateral defect in the femur, the femoral  stem only bypassed the fracture segment by less than a centimeter. Medially  and posterior, there was bone that the prosthesis rest in. With the fracture  reduced provisionally, a tibial allograft was cut and contoured to about a 6  inch piece and then placed across the fracture site on this anterolateral  defect and provisionally wired together with 18 gauge wire. At this point,  two [employer] plates, one 10 inch and one 8 inch was brought onto  the field. The 10 inch plate was used laterally to provide four bicortical  screws proximal to the fracture and allow for four Dall-Miles cables distal  to the fracture. An 8 inch plate was then used to allow for fully  overlapping Dall-Miles cables distal and three bicortical screws proximal.  The anterior plate was placed first with the bicortical screws placed. Note  that the four cables have been passed prior to placement of the plate.  Following placement of the anterior plate, the lateral plate was placed and  held in place with Verbrugge bone clamp. Proximal screws were placed  diverging away from the anterior screws. The Dall-Miles cable system was  then applied to the distal portion of the construct with three of the [employer] cables overlapping with the two plates and a single cable placed on the lateral plate distally. This last wire was just above  the prosthesis on  the native bone. At this point, a combination of platelet rich plasma and  allograft with BVM from Depuy was packed in and around the defect. Note that  a 3 cm segment of this was placed before the lateral plate was placed inside  the defect. The remaining portions of this bone graft was then applied in  and around the defect through the plate  holes. Following this, this section  of the wound was covered and the wound was copiously irrigated with normal  saline solution. A deep drain was placed and brought out proximally. The  iliotibial band was then reapproximated using #0 PDS suture. The vastus  lateralis remained basically in continuity with the iliotibial band and  provided excellent coverage to the plate proximal. At this point, the deep  fat layer was reapproximated using #0 Vicryl. 2-0 Vicryl was then used to  reapproximate the subcutaneous layer and staples for the skin edges. The leg  wound was then cleaned and dried and dressed sterilely with Adaptic  dressing, sponges, provisional tape and two 6 inch Ace wraps. The patient  was extubated and transported to the recovery room in stable condition.                                               Madlyn Frankel [name], M.D.    MDO/MEDQ  D:  02/26/2003  T:  02/26/2003  Job:  [id number]

## 2011-02-19 LAB — CBC
MCHC: 33.8
MCHC: 33.9
MCV: 88.9
MCV: 89.3
Platelets: 171
Platelets: 177
Platelets: 233
RBC: 2.47 — ABNORMAL LOW
RBC: 2.89 — ABNORMAL LOW
WBC: 7.1

## 2011-02-19 LAB — BASIC METABOLIC PANEL
BUN: 5 — ABNORMAL LOW
BUN: 5 — ABNORMAL LOW
CO2: 28
CO2: 28
Calcium: 7.6 — ABNORMAL LOW
Chloride: 101
Creatinine, Ser: 0.43
Creatinine, Ser: 0.48
GFR calc Af Amer: >60

## 2011-02-19 LAB — URINALYSIS, ROUTINE W REFLEX MICROSCOPIC
Bilirubin Urine: NEGATIVE
Nitrite: POSITIVE — AB
Specific Gravity, Urine: 1.019
pH: 6

## 2011-02-19 LAB — COMPREHENSIVE METABOLIC PANEL
AST: 19
Albumin: 3.2 — ABNORMAL LOW
Chloride: 102
Creatinine, Ser: 0.44
GFR calc Af Amer: >60
Sodium: 138
Total Bilirubin: 0.5

## 2011-02-19 LAB — PREPARE RBC (CROSSMATCH)

## 2011-02-19 LAB — APTT: aPTT: 26

## 2011-02-19 LAB — TYPE AND SCREEN

## 2011-02-19 LAB — URINE MICROSCOPIC-ADD ON

## 2011-02-19 LAB — HEMOGLOBIN AND HEMATOCRIT, BLOOD: Hemoglobin: 10.1 — ABNORMAL LOW

## 2011-02-22 LAB — I-STAT 8, (EC8 V) (CONVERTED LAB)
Acid-Base Excess: 5 — ABNORMAL HIGH
Chloride: 98
Glucose, Bld: 105 — ABNORMAL HIGH
Hemoglobin: 12.6
Potassium: 3.7
Sodium: 132 — ABNORMAL LOW
TCO2: 32

## 2011-02-22 LAB — RETICULOCYTES
RBC.: 3.35 — ABNORMAL LOW
Retic Count, Absolute: 46.9

## 2011-02-22 LAB — CROSSMATCH

## 2011-02-22 LAB — FERRITIN: Ferritin: 170 (ref 10–291)

## 2011-02-22 LAB — PROTIME-INR
INR: 1.1
INR: 1.1
Prothrombin Time: 14.3

## 2011-02-22 LAB — BASIC METABOLIC PANEL
BUN: 5 — ABNORMAL LOW
BUN: 6
BUN: 10
BUN: 11
CO2: 27
CO2: 28
Calcium: 7.8 — ABNORMAL LOW
Calcium: 8.2 — ABNORMAL LOW
Calcium: 8.8
Chloride: 103
Chloride: 104
Chloride: 105
Chloride: 100
Creatinine, Ser: 0.39 — ABNORMAL LOW
Creatinine, Ser: 0.41
Creatinine, Ser: 0.45
Creatinine, Ser: 0.37 — ABNORMAL LOW
Creatinine, Ser: 0.44
GFR calc Af Amer: >60
GFR calc Af Amer: >60
GFR calc Af Amer: >60
GFR calc Af Amer: >60
GFR calc Af Amer: >60
GFR calc non Af Amer: >60
GFR calc non Af Amer: >60
GFR calc non Af Amer: >60
GFR calc non Af Amer: >60
Glucose, Bld: 117 — ABNORMAL HIGH
Glucose, Bld: 129 — ABNORMAL HIGH
Glucose, Bld: 85
Glucose, Bld: 90
Potassium: 3.4 — ABNORMAL LOW
Potassium: 3.8
Potassium: 4.3
Potassium: 4
Sodium: 132 — ABNORMAL LOW
Sodium: 135
Sodium: 136

## 2011-02-22 LAB — CBC
HCT: 23.2 — ABNORMAL LOW
HCT: 27.1 — ABNORMAL LOW
HCT: 28.4 — ABNORMAL LOW
Hemoglobin: 10.9 — ABNORMAL LOW
Hemoglobin: 11 — ABNORMAL LOW
Hemoglobin: 7.8 — CL
Hemoglobin: 8.9 — ABNORMAL LOW
Hemoglobin: 9 — ABNORMAL LOW
Hemoglobin: 9.9 — ABNORMAL LOW
MCHC: 32.9
MCHC: 33
MCHC: 33.3
MCV: 89.3
MCV: 89.5
MCV: 89.8
MCV: 90
MCV: 90.2
MCV: 87.3
Platelets: 175
Platelets: 312
RBC: 2.94 — ABNORMAL LOW
RBC: 3.27 — ABNORMAL LOW
RBC: 3.46 — ABNORMAL LOW
RDW: 14.7 — ABNORMAL HIGH
RDW: 14.8 — ABNORMAL HIGH
RDW: 14.8 — ABNORMAL HIGH
RDW: 14.9 — ABNORMAL HIGH
RDW: 14.3 — ABNORMAL HIGH
WBC: 10.3
WBC: 10.6 — ABNORMAL HIGH
WBC: 10.7 — ABNORMAL HIGH
WBC: 9.5
WBC: 5.2
WBC: 5.7

## 2011-02-22 LAB — COMPREHENSIVE METABOLIC PANEL
ALT: 13
ALT: 11
AST: 16
Alkaline Phosphatase: 105
BUN: 7
CO2: 27
CO2: 26
Calcium: 8.9
Chloride: 97
Creatinine, Ser: 0.32 — ABNORMAL LOW
GFR calc Af Amer: >60
GFR calc non Af Amer: >60
GFR calc non Af Amer: >60
Glucose, Bld: 80
Glucose, Bld: 85
Potassium: 3.1 — ABNORMAL LOW
Sodium: 136
Sodium: 131 — ABNORMAL LOW
Total Bilirubin: 0.5
Total Protein: 5.7 — ABNORMAL LOW

## 2011-02-22 LAB — ANAEROBIC CULTURE

## 2011-02-22 LAB — IRON AND TIBC: Iron: 59

## 2011-02-22 LAB — C-REACTIVE PROTEIN: CRP: 11.6 — ABNORMAL HIGH (ref <0.6)

## 2011-02-22 LAB — APTT: aPTT: 28

## 2011-02-22 LAB — WOUND CULTURE: Gram Stain: NONE SEEN

## 2011-02-22 LAB — LIPID PANEL
Cholesterol: 181
HDL: 77
LDL Cholesterol: 93
Total CHOL/HDL Ratio: 2.4

## 2011-02-22 LAB — POCT I-STAT CREATININE: Operator id: 151321

## 2011-05-26 DIAGNOSIS — M818 Other osteoporosis without current pathological fracture: Secondary | ICD-10-CM | POA: Diagnosis not present

## 2011-09-02 DIAGNOSIS — I10 Essential (primary) hypertension: Secondary | ICD-10-CM | POA: Diagnosis not present

## 2011-12-02 DIAGNOSIS — I10 Essential (primary) hypertension: Secondary | ICD-10-CM | POA: Diagnosis not present

## 2012-03-07 DIAGNOSIS — L84 Corns and callosities: Secondary | ICD-10-CM | POA: Diagnosis not present

## 2012-03-07 DIAGNOSIS — G40909 Epilepsy, unspecified, not intractable, without status epilepticus: Secondary | ICD-10-CM | POA: Diagnosis not present

## 2012-04-28 DIAGNOSIS — H2589 Other age-related cataract: Secondary | ICD-10-CM | POA: Diagnosis not present

## 2012-05-22 DIAGNOSIS — R609 Edema, unspecified: Secondary | ICD-10-CM | POA: Diagnosis not present

## 2012-08-22 DIAGNOSIS — I10 Essential (primary) hypertension: Secondary | ICD-10-CM | POA: Diagnosis not present

## 2012-11-21 DIAGNOSIS — I872 Venous insufficiency (chronic) (peripheral): Secondary | ICD-10-CM | POA: Diagnosis not present

## 2013-02-23 DIAGNOSIS — I872 Venous insufficiency (chronic) (peripheral): Secondary | ICD-10-CM | POA: Diagnosis not present

## 2013-02-23 DIAGNOSIS — I10 Essential (primary) hypertension: Secondary | ICD-10-CM | POA: Diagnosis not present

## 2013-08-28 DIAGNOSIS — M549 Dorsalgia, unspecified: Secondary | ICD-10-CM | POA: Diagnosis not present

## 2013-08-28 DIAGNOSIS — I10 Essential (primary) hypertension: Secondary | ICD-10-CM | POA: Diagnosis not present

## 2013-11-25 DIAGNOSIS — M79609 Pain in unspecified limb: Secondary | ICD-10-CM | POA: Diagnosis not present

## 2013-11-25 DIAGNOSIS — Z96659 Presence of unspecified artificial knee joint: Secondary | ICD-10-CM | POA: Diagnosis not present

## 2013-11-25 DIAGNOSIS — M25559 Pain in unspecified hip: Secondary | ICD-10-CM | POA: Diagnosis not present

## 2013-11-25 DIAGNOSIS — S72043A Displaced fracture of base of neck of unspecified femur, initial encounter for closed fracture: Secondary | ICD-10-CM | POA: Diagnosis not present

## 2013-11-25 DIAGNOSIS — I10 Essential (primary) hypertension: Secondary | ICD-10-CM | POA: Diagnosis present

## 2013-11-25 DIAGNOSIS — M7989 Other specified soft tissue disorders: Secondary | ICD-10-CM | POA: Diagnosis not present

## 2013-11-25 DIAGNOSIS — G40909 Epilepsy, unspecified, not intractable, without status epilepticus: Secondary | ICD-10-CM | POA: Diagnosis not present

## 2013-11-25 DIAGNOSIS — W010XXA Fall on same level from slipping, tripping and stumbling without subsequent striking against object, initial encounter: Secondary | ICD-10-CM | POA: Diagnosis not present

## 2013-11-25 DIAGNOSIS — W19XXXA Unspecified fall, initial encounter: Secondary | ICD-10-CM | POA: Diagnosis not present

## 2013-11-25 DIAGNOSIS — R296 Repeated falls: Secondary | ICD-10-CM | POA: Diagnosis not present

## 2013-11-25 DIAGNOSIS — S72009A Fracture of unspecified part of neck of unspecified femur, initial encounter for closed fracture: Secondary | ICD-10-CM | POA: Diagnosis not present

## 2013-11-25 DIAGNOSIS — Z2821 Immunization not carried out because of patient refusal: Secondary | ICD-10-CM | POA: Diagnosis not present

## 2013-11-27 ENCOUNTER — Encounter (HOSPITAL_COMMUNITY)
Admission: AD | Disposition: A | Discharge status: Skilled Nursing Facility | Payer: Self-pay | Source: Other Acute Inpatient Hospital | Attending: Family Medicine

## 2013-11-27 ENCOUNTER — Encounter (HOSPITAL_COMMUNITY): Payer: Self-pay | Admitting: Internal Medicine

## 2013-11-27 ENCOUNTER — Inpatient Hospital Stay (HOSPITAL_COMMUNITY)
Admission: AD | Admit: 2013-11-27 | Discharge: 2013-12-02 | DRG: 470 | Disposition: A | Discharge status: Skilled Nursing Facility | Payer: Medicare Other | Source: Other Acute Inpatient Hospital | Attending: Family Medicine | Admitting: Family Medicine

## 2013-11-27 ENCOUNTER — Inpatient Hospital Stay: Admit: 2013-11-27 | Payer: Self-pay | Admitting: Orthopedic Surgery

## 2013-11-27 DIAGNOSIS — R262 Difficulty in walking, not elsewhere classified: Secondary | ICD-10-CM | POA: Diagnosis not present

## 2013-11-27 DIAGNOSIS — Y92009 Unspecified place in unspecified non-institutional (private) residence as the place of occurrence of the external cause: Secondary | ICD-10-CM

## 2013-11-27 DIAGNOSIS — E871 Hypo-osmolality and hyponatremia: Secondary | ICD-10-CM | POA: Diagnosis not present

## 2013-11-27 DIAGNOSIS — W010XXA Fall on same level from slipping, tripping and stumbling without subsequent striking against object, initial encounter: Secondary | ICD-10-CM | POA: Diagnosis present

## 2013-11-27 DIAGNOSIS — D649 Anemia, unspecified: Secondary | ICD-10-CM | POA: Diagnosis not present

## 2013-11-27 DIAGNOSIS — K219 Gastro-esophageal reflux disease without esophagitis: Secondary | ICD-10-CM | POA: Diagnosis present

## 2013-11-27 DIAGNOSIS — M948X9 Other specified disorders of cartilage, unspecified sites: Secondary | ICD-10-CM | POA: Diagnosis present

## 2013-11-27 DIAGNOSIS — S72009D Fracture of unspecified part of neck of unspecified femur, subsequent encounter for closed fracture with routine healing: Secondary | ICD-10-CM | POA: Diagnosis not present

## 2013-11-27 DIAGNOSIS — Z471 Aftercare following joint replacement surgery: Secondary | ICD-10-CM | POA: Diagnosis not present

## 2013-11-27 DIAGNOSIS — S72009A Fracture of unspecified part of neck of unspecified femur, initial encounter for closed fracture: Secondary | ICD-10-CM

## 2013-11-27 DIAGNOSIS — E876 Hypokalemia: Secondary | ICD-10-CM | POA: Diagnosis not present

## 2013-11-27 DIAGNOSIS — I959 Hypotension, unspecified: Secondary | ICD-10-CM | POA: Diagnosis not present

## 2013-11-27 DIAGNOSIS — Z9181 History of falling: Secondary | ICD-10-CM | POA: Diagnosis not present

## 2013-11-27 DIAGNOSIS — Z8249 Family history of ischemic heart disease and other diseases of the circulatory system: Secondary | ICD-10-CM | POA: Diagnosis not present

## 2013-11-27 DIAGNOSIS — M6281 Muscle weakness (generalized): Secondary | ICD-10-CM | POA: Diagnosis not present

## 2013-11-27 DIAGNOSIS — S72043A Displaced fracture of base of neck of unspecified femur, initial encounter for closed fracture: Secondary | ICD-10-CM

## 2013-11-27 DIAGNOSIS — M25559 Pain in unspecified hip: Secondary | ICD-10-CM | POA: Diagnosis not present

## 2013-11-27 DIAGNOSIS — I10 Essential (primary) hypertension: Secondary | ICD-10-CM | POA: Diagnosis present

## 2013-11-27 DIAGNOSIS — Z96659 Presence of unspecified artificial knee joint: Secondary | ICD-10-CM | POA: Diagnosis not present

## 2013-11-27 DIAGNOSIS — R279 Unspecified lack of coordination: Secondary | ICD-10-CM | POA: Diagnosis not present

## 2013-11-27 DIAGNOSIS — Z79899 Other long term (current) drug therapy: Secondary | ICD-10-CM | POA: Diagnosis not present

## 2013-11-27 DIAGNOSIS — F411 Generalized anxiety disorder: Secondary | ICD-10-CM | POA: Diagnosis present

## 2013-11-27 DIAGNOSIS — G40909 Epilepsy, unspecified, not intractable, without status epilepticus: Secondary | ICD-10-CM | POA: Diagnosis present

## 2013-11-27 DIAGNOSIS — Z96649 Presence of unspecified artificial hip joint: Secondary | ICD-10-CM | POA: Diagnosis not present

## 2013-11-27 DIAGNOSIS — J9819 Other pulmonary collapse: Secondary | ICD-10-CM | POA: Diagnosis not present

## 2013-11-27 DIAGNOSIS — R509 Fever, unspecified: Secondary | ICD-10-CM | POA: Diagnosis not present

## 2013-11-27 DIAGNOSIS — R41841 Cognitive communication deficit: Secondary | ICD-10-CM | POA: Diagnosis not present

## 2013-11-27 DIAGNOSIS — S139XXA Sprain of joints and ligaments of unspecified parts of neck, initial encounter: Secondary | ICD-10-CM | POA: Diagnosis not present

## 2013-11-27 DIAGNOSIS — R569 Unspecified convulsions: Secondary | ICD-10-CM | POA: Diagnosis not present

## 2013-11-27 DIAGNOSIS — S72001A Fracture of unspecified part of neck of right femur, initial encounter for closed fracture: Secondary | ICD-10-CM

## 2013-11-27 DIAGNOSIS — R489 Unspecified symbolic dysfunctions: Secondary | ICD-10-CM | POA: Diagnosis not present

## 2013-11-27 HISTORY — DX: Unspecified convulsions: R56.9

## 2013-11-27 HISTORY — DX: Gastro-esophageal reflux disease without esophagitis: K21.9

## 2013-11-27 HISTORY — DX: Anxiety disorder, unspecified: F41.9

## 2013-11-27 HISTORY — DX: Essential (primary) hypertension: I10

## 2013-11-27 LAB — BASIC METABOLIC PANEL
Anion gap: 11 (ref 5–15)
BUN: 12 mg/dL (ref 6–23)
CALCIUM: 8.2 mg/dL — AB (ref 8.4–10.5)
CO2: 25 mEq/L (ref 19–32)
CREATININE: 0.55 mg/dL (ref 0.50–1.10)
Chloride: 94 mEq/L — ABNORMAL LOW (ref 96–112)
GFR calc Af Amer: >90 mL/min (ref >90)
GFR, EST NON AFRICAN AMERICAN: 82 mL/min — AB (ref >90)
GLUCOSE: 132 mg/dL — AB (ref 70–99)
POTASSIUM: 3.6 meq/L — AB (ref 3.7–5.3)
Sodium: 130 mEq/L — ABNORMAL LOW (ref 137–147)

## 2013-11-27 LAB — CBC WITH DIFFERENTIAL/PLATELET
BASOS ABS: 0 10*3/uL (ref 0.0–0.1)
Basophils Relative: 0 % (ref 0–1)
EOS PCT: 3 % (ref 0–5)
Eosinophils Absolute: 0.2 10*3/uL (ref 0.0–0.7)
HEMATOCRIT: 35.4 % — AB (ref 36.0–46.0)
Hemoglobin: 11.6 g/dL — ABNORMAL LOW (ref 12.0–15.0)
LYMPHS PCT: 12 % (ref 12–46)
Lymphs Abs: 0.8 10*3/uL (ref 0.7–4.0)
MCH: 30.7 pg (ref 26.0–34.0)
MCHC: 32.8 g/dL (ref 30.0–36.0)
MCV: 93.7 fL (ref 78.0–100.0)
MONO ABS: 0.6 10*3/uL (ref 0.1–1.0)
Monocytes Relative: 9 % (ref 3–12)
Neutro Abs: 5.1 10*3/uL (ref 1.7–7.7)
Neutrophils Relative %: 76 % (ref 43–77)
Platelets: 149 10*3/uL — ABNORMAL LOW (ref 150–400)
RBC: 3.78 MIL/uL — ABNORMAL LOW (ref 3.87–5.11)
RDW: 12.7 % (ref 11.5–15.5)
WBC: 6.8 10*3/uL (ref 4.0–10.5)

## 2013-11-27 LAB — PHENYTOIN LEVEL, TOTAL: Phenytoin Lvl: 12.3 ug/mL (ref 10.0–20.0)

## 2013-11-27 LAB — SURGICAL PCR SCREEN
MRSA, PCR: NEGATIVE
Staphylococcus aureus: NEGATIVE

## 2013-11-27 LAB — MAGNESIUM: Magnesium: 1.7 mg/dL (ref 1.5–2.5)

## 2013-11-27 SURGERY — HEMIARTHROPLASTY, HIP, DIRECT ANTERIOR APPROACH, FOR FRACTURE
Anesthesia: General

## 2013-11-27 SURGERY — HEMIARTHROPLASTY, HIP, DIRECT ANTERIOR APPROACH, FOR FRACTURE
Anesthesia: General | Laterality: Right

## 2013-11-27 MED ORDER — DEXTROSE 5 % IV SOLN
1.0000 g | INTRAVENOUS | Status: DC
  Administered 2013-11-27 – 2013-12-02 (×6): 1 g via INTRAVENOUS
  Filled 2013-11-27 (×6): qty 10

## 2013-11-27 MED ORDER — METOCLOPRAMIDE HCL 10 MG PO TABS: 5.0000 mg | ORAL_TABLET | Freq: Three times a day (TID) | ORAL | Status: DC | PRN

## 2013-11-27 MED ORDER — PROPOFOL 10 MG/ML IV BOLUS
INTRAVENOUS | Status: AC
  Filled 2013-11-27: qty 20

## 2013-11-27 MED ORDER — PANTOPRAZOLE SODIUM 40 MG PO TBEC
40.0000 mg | DELAYED_RELEASE_TABLET | Freq: Every day | ORAL | Status: DC
Start: 2013-11-27 — End: 2013-12-02
  Administered 2013-11-27 – 2013-12-02 (×6): 40 mg via ORAL
  Filled 2013-11-27 (×6): qty 1

## 2013-11-27 MED ORDER — ONDANSETRON HCL 4 MG PO TABS: 4.0000 mg | ORAL_TABLET | Freq: Four times a day (QID) | ORAL | Status: DC | PRN

## 2013-11-27 MED ORDER — MORPHINE SULFATE 2 MG/ML IJ SOLN: 1.0000 mg | INTRAMUSCULAR | Status: DC | PRN

## 2013-11-27 MED ORDER — PHENYTOIN SODIUM EXTENDED 100 MG PO CAPS
100.0000 mg | ORAL_CAPSULE | Freq: Three times a day (TID) | ORAL | Status: DC
  Administered 2013-11-27 – 2013-12-02 (×14): 100 mg via ORAL
  Filled 2013-11-27 (×18): qty 1

## 2013-11-27 MED ORDER — SORBITOL 70 % SOLN
30.0000 mL | Freq: Every day | Status: DC | PRN
  Filled 2013-11-27 (×2): qty 30

## 2013-11-27 MED ORDER — FENTANYL CITRATE 0.05 MG/ML IJ SOLN
INTRAMUSCULAR | Status: AC
  Filled 2013-11-27: qty 5

## 2013-11-27 MED ORDER — METOCLOPRAMIDE HCL 5 MG/ML IJ SOLN: 5.0000 mg | Freq: Three times a day (TID) | INTRAMUSCULAR | Status: DC | PRN

## 2013-11-27 MED ORDER — METOPROLOL SUCCINATE ER 25 MG PO TB24
25.0000 mg | ORAL_TABLET | Freq: Every day | ORAL | Status: DC
  Administered 2013-11-27: 25 mg via ORAL
  Filled 2013-11-27: qty 1

## 2013-11-27 MED ORDER — ONDANSETRON HCL 4 MG/2ML IJ SOLN: 4.0000 mg | Freq: Four times a day (QID) | INTRAMUSCULAR | Status: DC | PRN

## 2013-11-27 MED ORDER — PHENOL 1.4 % MT LIQD
1.0000 | OROMUCOSAL | Status: DC | PRN
  Filled 2013-11-27: qty 177

## 2013-11-27 MED ORDER — MENTHOL 3 MG MT LOZG
1.0000 | LOZENGE | OROMUCOSAL | Status: DC | PRN
  Filled 2013-11-27: qty 9

## 2013-11-27 MED ORDER — LIDOCAINE HCL (CARDIAC) 20 MG/ML IV SOLN
INTRAVENOUS | Status: AC
  Filled 2013-11-27: qty 5

## 2013-11-27 MED ORDER — FLEET ENEMA 7-19 GM/118ML RE ENEM: 1.0000 | ENEMA | Freq: Once | RECTAL | Status: AC | PRN

## 2013-11-27 MED ORDER — ACETAMINOPHEN 325 MG PO TABS
650.0000 mg | ORAL_TABLET | Freq: Four times a day (QID) | ORAL | Status: DC | PRN
  Administered 2013-11-30 – 2013-12-01 (×2): 650 mg via ORAL
  Filled 2013-11-27 (×2): qty 2

## 2013-11-27 MED ORDER — METOPROLOL TARTRATE 50 MG PO TABS
50.0000 mg | ORAL_TABLET | Freq: Every day | ORAL | Status: DC
  Administered 2013-11-28 – 2013-11-29 (×2): 50 mg via ORAL
  Filled 2013-11-27 (×2): qty 1

## 2013-11-27 MED ORDER — SODIUM CHLORIDE 0.9 % IV SOLN
INTRAVENOUS | Status: DC
  Administered 2013-11-27 – 2013-11-30 (×5): via INTRAVENOUS
  Administered 2013-12-01: 1000 mL via INTRAVENOUS

## 2013-11-27 MED ORDER — METOPROLOL TARTRATE 25 MG PO TABS
25.0000 mg | ORAL_TABLET | Freq: Every day | ORAL | Status: DC
  Administered 2013-11-27 – 2013-11-28 (×2): 25 mg via ORAL
  Filled 2013-11-27 (×3): qty 1

## 2013-11-27 MED ORDER — OXYCODONE-ACETAMINOPHEN 5-325 MG PO TABS
1.0000 | ORAL_TABLET | ORAL | Status: DC | PRN
  Administered 2013-11-27 – 2013-12-02 (×6): 1 via ORAL
  Filled 2013-11-27 (×6): qty 1

## 2013-11-27 MED ORDER — ACETAMINOPHEN 650 MG RE SUPP: 650.0000 mg | Freq: Four times a day (QID) | RECTAL | Status: DC | PRN

## 2013-11-27 MED ORDER — ENOXAPARIN SODIUM 40 MG/0.4ML ~~LOC~~ SOLN
40.0000 mg | Freq: Once | SUBCUTANEOUS | Status: AC
  Administered 2013-11-27: 40 mg via SUBCUTANEOUS
  Filled 2013-11-27: qty 0.4

## 2013-11-27 MED ORDER — DOCUSATE SODIUM 100 MG PO CAPS
100.0000 mg | ORAL_CAPSULE | Freq: Two times a day (BID) | ORAL | Status: DC
  Administered 2013-11-27 – 2013-12-02 (×10): 100 mg via ORAL

## 2013-11-27 MED ORDER — ENOXAPARIN SODIUM 40 MG/0.4ML ~~LOC~~ SOLN
40.0000 mg | Freq: Once | SUBCUTANEOUS | Status: DC
  Filled 2013-11-27: qty 0.4

## 2013-11-27 MED ORDER — ONDANSETRON HCL 4 MG/2ML IJ SOLN
INTRAMUSCULAR | Status: AC
  Filled 2013-11-27: qty 2

## 2013-11-27 MED ORDER — SENNOSIDES-DOCUSATE SODIUM 8.6-50 MG PO TABS: 1.0000 | ORAL_TABLET | Freq: Every evening | ORAL | Status: DC | PRN

## 2013-11-27 MED ORDER — FUROSEMIDE 20 MG PO TABS
20.0000 mg | ORAL_TABLET | Freq: Every day | ORAL | Status: DC
  Administered 2013-11-27: 20 mg via ORAL
  Filled 2013-11-27: qty 1

## 2013-11-27 NOTE — H&P (Signed)
Melissa Bradley is an 80 y.o. female.   Chief Complaint: Right hip pain. HPI: Pt is a 80 yr old woman who lives at home by herself.  She states that she was standing and looking out her window. She stumbled when she went to turn away and fell between her bed and dresser.  Her brother had to come to help her up.  He was leading her out of her room when she fell again. On both occassions she fell on her right side. When she arrived at the ED in [place], she was unable to move her right leg due to pain.  She is found to have a femoral neck fracture on the right side.  Past Medical History  Diagnosis Date  . Hypertension   . Anxiety   . Seizures   . GERD (gastroesophageal reflux disease)     Past Surgical History  Procedure Laterality Date  . Joint replacement    . Fracture surgery      Family History  Problem Relation Age of Onset  . Coronary artery disease    . Seizures    . Cancer     Social History:  reports that she has never smoked. She does not have any smokeless tobacco history on file. She reports that she does not drink alcohol or use illicit drugs.  Allergies: No Known Allergies  Medications Prior to Admission  Medication Sig Dispense Refill  . furosemide (LASIX) 20 MG tablet Take 20 mg by mouth daily.      . metoprolol succinate (TOPROL-XL) 25 MG 24 hr tablet Take 25 mg by mouth daily.      Marland Kitchen omeprazole (PRILOSEC) 20 MG capsule Take 20 mg by mouth daily.      . phenytoin (DILANTIN) 100 MG ER capsule Take by mouth 3 (three) times daily.        No results found for this or any previous visit (from the past 48 hour(s)). No results found.  Review of Systems  Constitutional: Negative for fever, chills, weight loss, malaise/fatigue and diaphoresis.  HENT: Negative for congestion and sore throat.   Eyes: Negative for blurred vision, discharge and redness.  Respiratory: Negative for cough, hemoptysis, sputum production and wheezing.   Cardiovascular: Negative for chest  pain, palpitations, leg swelling and PND.  Gastrointestinal: Negative for heartburn, nausea, vomiting, abdominal pain and diarrhea.  Genitourinary: Negative for dysuria, urgency and frequency.  Musculoskeletal: Positive for falls. Negative for back pain, joint pain and neck pain.  Skin: Negative for itching and rash.  Neurological: Negative for dizziness, tremors, focal weakness, loss of consciousness, weakness and headaches.  Endo/Heme/Allergies: Negative for environmental allergies. Does not bruise/bleed easily.  Psychiatric/Behavioral: Negative for depression. The patient is not nervous/anxious.     Blood pressure 148/94, pulse 70, temperature 98 F (36.7 C), temperature source Oral, height 5\' 5"  (1.651 m), weight 65.772 kg (145 lb), SpO2 98.00%. Physical Exam  Constitutional: She is oriented to person, place, and time. She appears well-developed and well-nourished.  HENT:  Head: Normocephalic and atraumatic.  Mouth/Throat: Oropharynx is clear and moist.  Eyes: Conjunctivae and EOM are normal. Pupils are equal, round, and reactive to light. No scleral icterus.  Neck: Normal range of motion. No JVD present. No tracheal deviation present. No thyromegaly present.  Cardiovascular: Normal rate and normal heart sounds.  Exam reveals no gallop and no friction rub.   No murmur heard. Respiratory: Effort normal and breath sounds normal. No stridor. No respiratory distress. She has no wheezes. She  has no rales. She exhibits no tenderness.  GI: Soft. Bowel sounds are normal. She exhibits no distension and no mass. There is no tenderness. There is no guarding.  Lymphadenopathy:    She has no cervical adenopathy.  Neurological: She is alert and oriented to person, place, and time. She displays normal reflexes. No cranial nerve deficit. She exhibits normal muscle tone.  Skin: Skin is warm and dry.  Psychiatric: She has a normal mood and affect. Her behavior is normal. Judgment and thought content  normal.     Assessment/Plan 1, Rt hip fracture. Orthopedic surgery has been consulted. All orders regarding the patient's fracture including pain control and DVT prophylaxis is as per orthopedic surgery. 2.Rt hip pain. - anagelsia 3. Hypertension - Continue home medications 4. Seizure disorder - Continue home medication. Check a dilantin level. 5. Anxiety. Anxiolytics prn. 6. GERD. Continue home medications.  Jessy Cybulski 11/27/2013, 1:40 AM

## 2013-11-27 NOTE — Progress Notes (Signed)
[facility] PROGRESS NOTE  Charlott Hollerdsell Wild ZOX:[id number] DOB: 08-23-1924 DOA: 11/27/2013 PCP: No primary provider on file.  Assessment/Plan: 1. Right Femoral Neck Fracture -Patient having a mechanical call at home. -She denied symptoms prior to fall although I think it's conceivable that urinary tract infection may have contributed to weakness.  -Case has been discussed with orthopedic surgery, Dr Lequita HaltAluisio who will evaluate patient this afternoon. -She does not appear to have acute cardiopulmonary issues. Denied chest pain or shortnessof breath prior to this event, residing in the community.  -Given older age and comorbidities she is likely at an intermediate operative risk.     2. Hypertension -SBP's in the 140-160 range -Pain may be contributing to increase blood pressures -Continue metoprolol 25 mg PO BID  3. History of seizure disorder -Continue Dilantin 100 mg PO TID  4. Urinary Tract Infection -Will start Rocephin 1g IV q 24 hours -Follow up on urine cultures  5. Hyponatremia.  -Could be related to dehydration -Will discontinue Lasix, provide gentle IV fluid hydration with lasix.   Code Status: Full Family Communication:  Disposition Plan: Orthopedic Surgery consulted, will likely need ORIF    Consultants:  Orthopedic Surgery   Antibiotics:  Rocephin 1 g IV (started on 11/27/2013)  HPI/Subjective: Patient was admitted this morning, presented as a transfer from [place]. She had a fall at home, stumbling in her bedroom landing on her right side. Her brother came to assist her, got her up then she had a second fall landing on the same side. Imagining studies showing the presence of right femoral neck fracture. I discussed case with Dr Lequita HaltAluisio of Orthopedic Surgery who will consult.    Objective: Filed Vitals:   11/27/13 1715  BP: 166/79  Pulse: 68  Temp: 99.2 F (37.3 C)  Resp: 16    Intake/Output Summary (Last 24 hours) at 11/27/13 1730 Last data  filed at 11/27/13 1718  Gross per 24 hour  Intake    175 ml  Output   1625 ml  Net  -1450 ml   Filed Weights   11/27/13 0135 11/27/13 0225  Weight: 65.772 kg (145 lb) 65.772 kg (145 lb)    Exam:   General:  Patient is in no acute distress, pleasant cooperative  Cardiovascular: Regular rate and rhythm, normal S1S2, has a 3/6 SEM  Respiratory: Clear to auscultaiton, scattered crackles  Abdomen: Soft nontender nondistended  Musculoskeletal: No edema, right leg externally rotated  Data Reviewed: Basic Metabolic Panel:  Recent Labs Lab 11/27/13 0200  NA 130*  K 3.6*  CL 94*  CO2 25  GLUCOSE 132*  BUN 12  CREATININE 0.55  CALCIUM 8.2*  MG 1.7   Liver Function Tests: No results found for this basename: AST, ALT, ALKPHOS, BILITOT, PROT, ALBUMIN,  in the last 168 hours No results found for this basename: LIPASE, AMYLASE,  in the last 168 hours No results found for this basename: AMMONIA,  in the last 168 hours CBC:  Recent Labs Lab 11/27/13 0200  WBC 6.8  NEUTROABS 5.1  HGB 11.6*  HCT 35.4*  MCV 93.7  PLT 149*   Cardiac Enzymes: No results found for this basename: CKTOTAL, CKMB, CKMBINDEX, TROPONINI,  in the last 168 hours BNP (last 3 results) No results found for this basename: PROBNP,  in the last 8760 hours CBG: No results found for this basename: GLUCAP,  in the last 168 hours  Recent Results (from the past 240 hour(s))  SURGICAL PCR SCREEN     Status:  None   Collection Time    11/27/13 10:52 AM      Result Value Ref Range Status   MRSA, PCR NEGATIVE  NEGATIVE Final   Staphylococcus aureus NEGATIVE  NEGATIVE Final   Comment:            The Xpert SA Assay (FDA     approved for NASAL specimens     in patients over 78 years of age),     is one component of     a comprehensive surveillance     program.  Test performance has     been validated by [employer] for patients greater     than or equal to 78 year old.     It is not intended     to  diagnose infection nor to     guide or monitor treatment.     Studies: No results found.  Scheduled Meds: . cefTRIAXone (ROCEPHIN)  IV  1 g Intravenous Q24H  . docusate sodium  100 mg Oral BID  . furosemide  20 mg Oral Daily  . [START ON 11/28/2013] metoprolol tartrate  50 mg Oral Daily   And  . metoprolol tartrate  25 mg Oral QHS  . pantoprazole  40 mg Oral Daily  . phenytoin  100 mg Oral TID   Continuous Infusions: . sodium chloride 75 mL/hr at 11/27/13 0740    Active Problems:   Fracture of femoral neck, right, closed   Acute hip pain   Unspecified essential hypertension   Anxiety state, unspecified   Seizures   Hip fracture requiring operative repair    Time spent:     Jeralyn Bennett  [facility] Pager [phone] If 7PM-7AM, please contact night-coverage at [email or web address], password [email or web address] 11/27/2013, 5:30 PM  LOS: 0 days

## 2013-11-27 NOTE — Interval H&P Note (Signed)
History and Physical Interval Note:  11/27/2013 7:43 PM  Melissa Bradley  has presented today for surgery, with the diagnosis of right femoral neck fracture  The various methods of treatment have been discussed with the patient and family. After consideration of risks, benefits and other options for treatment, the patient has consented to  Procedure(s): REMOVAL OF HARDWARE RIGHT HIP AND RIGHT HEMI-ARTHROPLASTY  HIP  (Right) as a surgical intervention .  The patient's history has been reviewed, patient examined, no change in status, stable for surgery.  I have reviewed the patient's chart and labs.  Questions were answered to the patient's satisfaction.     Shelda PalLIN,Merial Moritz D

## 2013-11-27 NOTE — H&P (View-Only) (Signed)
Reason for Consult:  Right hip fracture Referring Physician:  ED physician  Melissa Bradley is an 80 y.o. female.  HPI:    Pt is a 80 yr old woman who lives at home by herself. She states that she was standing and looking out her window. She stumbled when she went to turn away and fell between her bed and dresser. Her brother had to come to help her up. He was leading her out of her room when she fell again. On both occassions she fell on her right side. When she arrived at the ED in [place], she was unable to move her right leg due to pain. She is found to have a femoral neck fracture on the right side.  She was then transferred when they were not able to do anything for her in [place].      Past Medical History  Diagnosis Date  . Hypertension   . Anxiety   . Seizures   . GERD (gastroesophageal reflux disease)     Past Surgical History  Procedure Laterality Date  . Joint replacement    . Fracture surgery      Family History  Problem Relation Age of Onset  . Coronary artery disease    . Seizures    . Cancer      Social History:  reports that she has never smoked. She does not have any smokeless tobacco history on file. She reports that she does not drink alcohol or use illicit drugs.  Allergies: No Known Allergies   Results for orders placed during the hospital encounter of 11/27/13 (from the past 48 hour(s))  BASIC METABOLIC PANEL     Status: Abnormal   Collection Time    11/27/13  2:00 AM      Result Value Ref Range   Sodium 130 (*) 137 - 147 mEq/L   Potassium 3.6 (*) 3.7 - 5.3 mEq/L   Chloride 94 (*) 96 - 112 mEq/L   CO2 25  19 - 32 mEq/L   Glucose, Bld 132 (*) 70 - 99 mg/dL   BUN 12  6 - 23 mg/dL   Creatinine, Ser 0.55  0.50 - 1.10 mg/dL   Calcium 8.2 (*) 8.4 - 10.5 mg/dL   GFR calc non Af Amer 82 (*) >90 mL/min   GFR calc Af Amer >90  >90 mL/min   Comment: (NOTE)     The eGFR has been calculated using the CKD EPI equation.     This calculation has not been  validated in all clinical situations.     eGFR's persistently <90 mL/min signify possible Chronic Kidney     Disease.   Anion gap 11  5 - 15  MAGNESIUM     Status: None   Collection Time    11/27/13  2:00 AM      Result Value Ref Range   Magnesium 1.7  1.5 - 2.5 mg/dL  CBC WITH DIFFERENTIAL     Status: Abnormal   Collection Time    11/27/13  2:00 AM      Result Value Ref Range   WBC 6.8  4.0 - 10.5 K/uL   RBC 3.78 (*) 3.87 - 5.11 MIL/uL   Hemoglobin 11.6 (*) 12.0 - 15.0 g/dL   HCT 35.4 (*) 36.0 - 46.0 %   MCV 93.7  78.0 - 100.0 fL   MCH 30.7  26.0 - 34.0 pg   MCHC 32.8  30.0 - 36.0 g/dL   RDW 12.7  11.5 -  15.5 %   Platelets 149 (*) 150 - 400 K/uL   Neutrophils Relative % 76  43 - 77 %   Neutro Abs 5.1  1.7 - 7.7 K/uL   Lymphocytes Relative 12  12 - 46 %   Lymphs Abs 0.8  0.7 - 4.0 K/uL   Monocytes Relative 9  3 - 12 %   Monocytes Absolute 0.6  0.1 - 1.0 K/uL   Eosinophils Relative 3  0 - 5 %   Eosinophils Absolute 0.2  0.0 - 0.7 K/uL   Basophils Relative 0  0 - 1 %   Basophils Absolute 0.0  0.0 - 0.1 K/uL  PHENYTOIN LEVEL, TOTAL     Status: None   Collection Time    11/27/13  4:00 AM      Result Value Ref Range   Phenytoin Lvl 12.3  10.0 - 20.0 ug/mL   Comment: Performed at [facility]     Status: None   Collection Time    11/27/13 10:52 AM      Result Value Ref Range   MRSA, PCR NEGATIVE  NEGATIVE   Staphylococcus aureus NEGATIVE  NEGATIVE   Comment:            The Xpert SA Assay (FDA     approved for NASAL specimens     in patients over 85 years of age),     is one component of     a comprehensive surveillance     program.  Test performance has     been validated by [employer] for patients greater     than or equal to 47 year old.     It is not intended     to diagnose infection nor to     guide or monitor treatment.      Review of Systems  Constitutional: Negative.   HENT: Negative.   Eyes: Negative.   Respiratory:  Negative.   Cardiovascular: Negative.   Gastrointestinal: Positive for heartburn.  Genitourinary: Negative.   Musculoskeletal: Positive for joint pain.  Skin: Negative.   Neurological: Negative.   Endo/Heme/Allergies: Negative.   Psychiatric/Behavioral: The patient is nervous/anxious.      Blood pressure 166/79, pulse 68, temperature 99.2 F (37.3 C), temperature source Axillary, resp. rate 16, height _0  (1.651 m), weight 65.772 kg (145 lb), SpO2 100.00%. Physical Exam  Constitutional: She appears well-developed and well-nourished.  HENT:  Head: Normocephalic and atraumatic.  Mouth/Throat: Oropharynx is clear and moist.  Neck: Neck supple. No JVD present. No tracheal deviation present. No thyromegaly present.  Cardiovascular: Normal rate.   Respiratory: Effort normal. No respiratory distress. She has no wheezes.  GI: Soft. There is no tenderness. There is no guarding.  Musculoskeletal:       Right hip: She exhibits decreased range of motion, decreased strength, tenderness and bony tenderness. She exhibits no laceration.  Neurological: She is alert.  Skin: Skin is warm and dry.  Psychiatric: She has a normal mood and affect.      Assessment/Plan: Right femoral neck fracture   NPO today Plan would be for a right hip hemiarthroplasty per Dr. Alvan Dame today (11/27/2013) Screws from previous surgery will also need to be removed in able to place the femoral component.   Pricilla Loveless 11/27/2013, 7:06 PM

## 2013-11-27 NOTE — Consult Note (Signed)
Reason for Consult:  Right hip fracture Referring Physician:  ED physician  Melissa Bradley is an 80 y.o. female.  HPI:    Pt is a 80 yr old woman who lives at home by herself. She states that she was standing and looking out her window. She stumbled when she went to turn away and fell between her bed and dresser. Her brother had to come to help her up. He was leading her out of her room when she fell again. On both occassions she fell on her right side. When she arrived at the ED in [place], she was unable to move her right leg due to pain. She is found to have a femoral neck fracture on the right side.  She was then transferred when they were not able to do anything for her in [place].      Past Medical History  Diagnosis Date  . Hypertension   . Anxiety   . Seizures   . GERD (gastroesophageal reflux disease)     Past Surgical History  Procedure Laterality Date  . Joint replacement    . Fracture surgery      Family History  Problem Relation Age of Onset  . Coronary artery disease    . Seizures    . Cancer      Social History:  reports that she has never smoked. She does not have any smokeless tobacco history on file. She reports that she does not drink alcohol or use illicit drugs.  Allergies: No Known Allergies   Results for orders placed during the hospital encounter of 11/27/13 (from the past 48 hour(s))  BASIC METABOLIC PANEL     Status: Abnormal   Collection Time    11/27/13  2:00 AM      Result Value Ref Range   Sodium 130 (*) 137 - 147 mEq/L   Potassium 3.6 (*) 3.7 - 5.3 mEq/L   Chloride 94 (*) 96 - 112 mEq/L   CO2 25  19 - 32 mEq/L   Glucose, Bld 132 (*) 70 - 99 mg/dL   BUN 12  6 - 23 mg/dL   Creatinine, Ser 0.55  0.50 - 1.10 mg/dL   Calcium 8.2 (*) 8.4 - 10.5 mg/dL   GFR calc non Af Amer 82 (*) >90 mL/min   GFR calc Af Amer >90  >90 mL/min   Comment: (NOTE)     The eGFR has been calculated using the CKD EPI equation.     This calculation has not been  validated in all clinical situations.     eGFR's persistently <90 mL/min signify possible Chronic Kidney     Disease.   Anion gap 11  5 - 15  MAGNESIUM     Status: None   Collection Time    11/27/13  2:00 AM      Result Value Ref Range   Magnesium 1.7  1.5 - 2.5 mg/dL  CBC WITH DIFFERENTIAL     Status: Abnormal   Collection Time    11/27/13  2:00 AM      Result Value Ref Range   WBC 6.8  4.0 - 10.5 K/uL   RBC 3.78 (*) 3.87 - 5.11 MIL/uL   Hemoglobin 11.6 (*) 12.0 - 15.0 g/dL   HCT 35.4 (*) 36.0 - 46.0 %   MCV 93.7  78.0 - 100.0 fL   MCH 30.7  26.0 - 34.0 pg   MCHC 32.8  30.0 - 36.0 g/dL   RDW 12.7  11.5 -   15.5 %   Platelets 149 (*) 150 - 400 K/uL   Neutrophils Relative % 76  43 - 77 %   Neutro Abs 5.1  1.7 - 7.7 K/uL   Lymphocytes Relative 12  12 - 46 %   Lymphs Abs 0.8  0.7 - 4.0 K/uL   Monocytes Relative 9  3 - 12 %   Monocytes Absolute 0.6  0.1 - 1.0 K/uL   Eosinophils Relative 3  0 - 5 %   Eosinophils Absolute 0.2  0.0 - 0.7 K/uL   Basophils Relative 0  0 - 1 %   Basophils Absolute 0.0  0.0 - 0.1 K/uL  PHENYTOIN LEVEL, TOTAL     Status: None   Collection Time    11/27/13  4:00 AM      Result Value Ref Range   Phenytoin Lvl 12.3  10.0 - 20.0 ug/mL   Comment: Performed at [facility]  SURGICAL PCR SCREEN     Status: None   Collection Time    11/27/13 10:52 AM      Result Value Ref Range   MRSA, PCR NEGATIVE  NEGATIVE   Staphylococcus aureus NEGATIVE  NEGATIVE   Comment:            The Xpert SA Assay (FDA     approved for NASAL specimens     in patients over 21 years of age),     is one component of     a comprehensive surveillance     program.  Test performance has     been validated by [facility] for patients greater     than or equal to 1 year old.     It is not intended     to diagnose infection nor to     guide or monitor treatment.      Review of Systems  Constitutional: Negative.   HENT: Negative.   Eyes: Negative.   Respiratory:  Negative.   Cardiovascular: Negative.   Gastrointestinal: Positive for heartburn.  Genitourinary: Negative.   Musculoskeletal: Positive for joint pain.  Skin: Negative.   Neurological: Negative.   Endo/Heme/Allergies: Negative.   Psychiatric/Behavioral: The patient is nervous/anxious.      Blood pressure 166/79, pulse 68, temperature 99.2 F (37.3 C), temperature source Axillary, resp. rate 16, height 5' 5" (1.651 m), weight 65.772 kg (145 lb), SpO2 100.00%. Physical Exam  Constitutional: She appears well-developed and well-nourished.  HENT:  Head: Normocephalic and atraumatic.  Mouth/Throat: Oropharynx is clear and moist.  Neck: Neck supple. No JVD present. No tracheal deviation present. No thyromegaly present.  Cardiovascular: Normal rate.   Respiratory: Effort normal. No respiratory distress. She has no wheezes.  GI: Soft. There is no tenderness. There is no guarding.  Musculoskeletal:       Right hip: She exhibits decreased range of motion, decreased strength, tenderness and bony tenderness. She exhibits no laceration.  Neurological: She is alert.  Skin: Skin is warm and dry.  Psychiatric: She has a normal mood and affect.      Assessment/Plan: Right femoral neck fracture   NPO today Plan would be for a right hip hemiarthroplasty per Dr. Olin today (11/27/2013) Screws from previous surgery will also need to be removed in able to place the femoral component.   Santana Edell Scott 11/27/2013, 7:06 PM      

## 2013-11-27 NOTE — Anesthesia Preprocedure Evaluation (Addendum)
Anesthesia Evaluation  Patient identified by MRN, date of birth, ID band Patient awake    Reviewed: Allergy & Precautions, H&P , NPO status , Patient's Chart, lab work & pertinent test results  Airway Mallampati: II TM Distance: >3 FB Neck ROM: Limited    Dental no notable dental hx.    Pulmonary neg pulmonary ROS,  breath sounds clear to auscultation  Pulmonary exam normal       Cardiovascular hypertension, Pt. on medications and Pt. on home beta blockers negative cardio ROS  Rhythm:Regular Rate:Normal     Neuro/Psych Seizures -,  PSYCHIATRIC DISORDERS Anxiety    GI/Hepatic Neg liver ROS, GERD-  ,  Endo/Other  negative endocrine ROS  Renal/GU negative Renal ROS     Musculoskeletal negative musculoskeletal ROS (+)   Abdominal   Peds  Hematology negative hematology ROS (+) anemia ,   Anesthesia Other Findings   Reproductive/Obstetrics negative OB ROS                        Anesthesia Physical Anesthesia Plan  ASA: III  Anesthesia Plan: General   Post-op Pain Management:    Induction: Intravenous  Airway Management Planned: Oral ETT  Additional Equipment:   Intra-op Plan:   Post-operative Plan: Extubation in OR  Informed Consent: I have reviewed the patients History and Physical, chart, labs and discussed the procedure including the risks, benefits and alternatives for the proposed anesthesia with the patient or authorized representative who has indicated his/her understanding and acceptance.   Dental advisory given  Plan Discussed with: CRNA  Anesthesia Plan Comments:       Anesthesia Quick Evaluation

## 2013-11-28 ENCOUNTER — Inpatient Hospital Stay (HOSPITAL_COMMUNITY): Payer: Medicare Other

## 2013-11-28 ENCOUNTER — Inpatient Hospital Stay (HOSPITAL_COMMUNITY): Payer: Medicare Other | Admitting: Anesthesiology

## 2013-11-28 ENCOUNTER — Encounter (HOSPITAL_COMMUNITY): Payer: Self-pay | Admitting: Certified Registered"

## 2013-11-28 ENCOUNTER — Encounter (HOSPITAL_COMMUNITY)
Admission: AD | Disposition: A | Discharge status: Skilled Nursing Facility | Payer: Self-pay | Source: Other Acute Inpatient Hospital | Attending: Family Medicine

## 2013-11-28 ENCOUNTER — Encounter (HOSPITAL_COMMUNITY): Payer: Medicare Other | Admitting: Anesthesiology

## 2013-11-28 HISTORY — PX: HIP ARTHROPLASTY: SHX981

## 2013-11-28 LAB — BASIC METABOLIC PANEL
Anion gap: 12 (ref 5–15)
BUN: 9 mg/dL (ref 6–23)
CALCIUM: 8.3 mg/dL — AB (ref 8.4–10.5)
CO2: 25 mEq/L (ref 19–32)
Chloride: 90 mEq/L — ABNORMAL LOW (ref 96–112)
Creatinine, Ser: 0.39 mg/dL — ABNORMAL LOW (ref 0.50–1.10)
GFR calc Af Amer: >90 mL/min (ref >90)
GLUCOSE: 89 mg/dL (ref 70–99)
Potassium: 3.4 mEq/L — ABNORMAL LOW (ref 3.7–5.3)
Sodium: 127 mEq/L — ABNORMAL LOW (ref 137–147)

## 2013-11-28 LAB — CBC
HCT: 32.8 % — ABNORMAL LOW (ref 36.0–46.0)
Hemoglobin: 11.2 g/dL — ABNORMAL LOW (ref 12.0–15.0)
MCH: 30.9 pg (ref 26.0–34.0)
MCHC: 34.1 g/dL (ref 30.0–36.0)
MCV: 90.4 fL (ref 78.0–100.0)
Platelets: 144 10*3/uL — ABNORMAL LOW (ref 150–400)
RBC: 3.63 MIL/uL — ABNORMAL LOW (ref 3.87–5.11)
RDW: 12.3 % (ref 11.5–15.5)
WBC: 5.7 10*3/uL (ref 4.0–10.5)

## 2013-11-28 SURGERY — HEMIARTHROPLASTY, HIP, DIRECT ANTERIOR APPROACH, FOR FRACTURE
Anesthesia: General | Site: Hip | Laterality: Right

## 2013-11-28 MED ORDER — SODIUM CHLORIDE 0.9 % IR SOLN
Status: DC | PRN
  Administered 2013-11-28: 3000 mL

## 2013-11-28 MED ORDER — CEFAZOLIN SODIUM-DEXTROSE 2-3 GM-% IV SOLR
INTRAVENOUS | Status: DC | PRN
  Administered 2013-11-28: 2 g via INTRAVENOUS

## 2013-11-28 MED ORDER — CEFAZOLIN SODIUM-DEXTROSE 2-3 GM-% IV SOLR
2.0000 g | Freq: Four times a day (QID) | INTRAVENOUS | Status: AC
  Administered 2013-11-28 – 2013-11-29 (×2): 2 g via INTRAVENOUS
  Filled 2013-11-28 (×2): qty 50

## 2013-11-28 MED ORDER — FENTANYL CITRATE 0.05 MG/ML IJ SOLN
INTRAMUSCULAR | Status: DC | PRN
  Administered 2013-11-28 (×2): 50 ug via INTRAVENOUS

## 2013-11-28 MED ORDER — FERROUS SULFATE 325 (65 FE) MG PO TABS
325.0000 mg | ORAL_TABLET | Freq: Three times a day (TID) | ORAL | Status: DC
  Administered 2013-11-29 – 2013-12-02 (×11): 325 mg via ORAL
  Filled 2013-11-28 (×13): qty 1

## 2013-11-28 MED ORDER — FENTANYL CITRATE 0.05 MG/ML IJ SOLN
INTRAMUSCULAR | Status: AC
  Filled 2013-11-28: qty 2

## 2013-11-28 MED ORDER — CEFAZOLIN SODIUM-DEXTROSE 2-3 GM-% IV SOLR
INTRAVENOUS | Status: AC
  Filled 2013-11-28: qty 50

## 2013-11-28 MED ORDER — ENOXAPARIN SODIUM 40 MG/0.4ML ~~LOC~~ SOLN
40.0000 mg | SUBCUTANEOUS | Status: AC
Start: 2013-11-28 — End: ?

## 2013-11-28 MED ORDER — OXYCODONE-ACETAMINOPHEN 5-325 MG PO TABS: 1.0000 | ORAL_TABLET | ORAL | Status: DC | PRN

## 2013-11-28 MED ORDER — 0.9 % SODIUM CHLORIDE (POUR BTL) OPTIME
TOPICAL | Status: DC | PRN
  Administered 2013-11-28: 1000 mL

## 2013-11-28 MED ORDER — POTASSIUM CHLORIDE CRYS ER 20 MEQ PO TBCR
40.0000 meq | EXTENDED_RELEASE_TABLET | Freq: Once | ORAL | Status: DC
  Filled 2013-11-28: qty 2

## 2013-11-28 MED ORDER — POLYETHYLENE GLYCOL 3350 17 G PO PACK: 17.0000 g | PACK | Freq: Every day | ORAL | Status: DC | PRN

## 2013-11-28 MED ORDER — ENOXAPARIN SODIUM 40 MG/0.4ML ~~LOC~~ SOLN
40.0000 mg | SUBCUTANEOUS | Status: DC
  Administered 2013-11-29 – 2013-12-02 (×4): 40 mg via SUBCUTANEOUS
  Filled 2013-11-28 (×4): qty 0.4

## 2013-11-28 MED ORDER — PROMETHAZINE HCL 25 MG/ML IJ SOLN: 6.2500 mg | INTRAMUSCULAR | Status: DC | PRN

## 2013-11-28 MED ORDER — LACTATED RINGERS IV SOLN: INTRAVENOUS | Status: DC

## 2013-11-28 MED ORDER — SUCCINYLCHOLINE CHLORIDE 20 MG/ML IJ SOLN
INTRAMUSCULAR | Status: DC | PRN
  Administered 2013-11-28: 80 mg via INTRAVENOUS

## 2013-11-28 MED ORDER — PROPOFOL 10 MG/ML IV BOLUS
INTRAVENOUS | Status: DC | PRN
  Administered 2013-11-28: 100 mg via INTRAVENOUS

## 2013-11-28 MED ORDER — ONDANSETRON HCL 4 MG/2ML IJ SOLN
INTRAMUSCULAR | Status: AC
  Filled 2013-11-28: qty 2

## 2013-11-28 MED ORDER — PROPOFOL 10 MG/ML IV BOLUS
INTRAVENOUS | Status: AC
  Filled 2013-11-28: qty 20

## 2013-11-28 MED ORDER — EPHEDRINE SULFATE 50 MG/ML IJ SOLN
INTRAMUSCULAR | Status: DC | PRN
  Administered 2013-11-28 (×2): 10 mg via INTRAVENOUS

## 2013-11-28 MED ORDER — LACTATED RINGERS IV SOLN
INTRAVENOUS | Status: DC | PRN
  Administered 2013-11-28 (×2): via INTRAVENOUS

## 2013-11-28 MED ORDER — FENTANYL CITRATE 0.05 MG/ML IJ SOLN
25.0000 ug | INTRAMUSCULAR | Status: DC | PRN
  Administered 2013-11-28 (×2): 25 ug via INTRAVENOUS

## 2013-11-28 MED ORDER — ONDANSETRON HCL 4 MG/2ML IJ SOLN
INTRAMUSCULAR | Status: DC | PRN
  Administered 2013-11-28: 4 mg via INTRAVENOUS

## 2013-11-28 SURGICAL SUPPLY — 62 items
ADH SKN CLS APL DERMABOND .7 (GAUZE/BANDAGES/DRESSINGS)
BAG SPEC THK2 15X12 ZIP CLS (MISCELLANEOUS) ×1
BAG ZIPLOCK 12X15 (MISCELLANEOUS) ×3 IMPLANT
BLADE SAW SGTL 18X1.27X75 (BLADE) ×2 IMPLANT
BLADE SAW SGTL 18X1.27X75MM (BLADE) ×1
CAPT HIP HD POR BIPOL/UNIPOL ×2 IMPLANT
CLOSURE WOUND 1/2 X4 (GAUZE/BANDAGES/DRESSINGS)
DERMABOND ADVANCED (GAUZE/BANDAGES/DRESSINGS)
DERMABOND ADVANCED .7 DNX12 (GAUZE/BANDAGES/DRESSINGS) ×1 IMPLANT
DRAPE INCISE IOBAN 85X60 (DRAPES) ×3 IMPLANT
DRAPE ORTHO SPLIT 77X108 STRL (DRAPES) ×6
DRAPE POUCH INSTRU U-SHP 10X18 (DRAPES) ×3 IMPLANT
DRAPE SURG 17X11 SM STRL (DRAPES) ×3 IMPLANT
DRAPE SURG ORHT 6 SPLT 77X108 (DRAPES) ×2 IMPLANT
DRAPE U-SHAPE 47X51 STRL (DRAPES) ×3 IMPLANT
DRSG AQUACEL AG ADV 3.5X10 (GAUZE/BANDAGES/DRESSINGS) ×3 IMPLANT
DRSG AQUACEL AG ADV 3.5X14 (GAUZE/BANDAGES/DRESSINGS) ×2 IMPLANT
DRSG MEPILEX BORDER 4X12 (GAUZE/BANDAGES/DRESSINGS) ×2 IMPLANT
DRSG TEGADERM 4X4.75 (GAUZE/BANDAGES/DRESSINGS) ×1 IMPLANT
DURAPREP 26ML APPLICATOR (WOUND CARE) ×3 IMPLANT
ELECT BLADE TIP CTD 4 INCH (ELECTRODE) ×3 IMPLANT
ELECT REM PT RETURN 9FT ADLT (ELECTROSURGICAL) ×3
ELECTRODE REM PT RTRN 9FT ADLT (ELECTROSURGICAL) ×1 IMPLANT
EVACUATOR 1/8 PVC DRAIN (DRAIN) ×1 IMPLANT
FACESHIELD WRAPAROUND (MASK) ×12 IMPLANT
FACESHIELD WRAPAROUND OR TEAM (MASK) ×4 IMPLANT
GAUZE SPONGE 2X2 8PLY STRL LF (GAUZE/BANDAGES/DRESSINGS) ×1 IMPLANT
GLOVE BIOGEL PI IND STRL 7.0 (GLOVE) IMPLANT
GLOVE BIOGEL PI IND STRL 7.5 (GLOVE) ×1 IMPLANT
GLOVE BIOGEL PI IND STRL 8 (GLOVE) ×1 IMPLANT
GLOVE BIOGEL PI INDICATOR 7.0 (GLOVE) ×2
GLOVE BIOGEL PI INDICATOR 7.5 (GLOVE) ×2
GLOVE BIOGEL PI INDICATOR 8 (GLOVE) ×2
GLOVE ECLIPSE 8.0 STRL XLNG CF (GLOVE) ×2 IMPLANT
GLOVE ORTHO TXT STRL SZ7.5 (GLOVE) ×6 IMPLANT
GLOVE SURG ORTHO 8.0 STRL STRW (GLOVE) ×1 IMPLANT
GLOVE SURG SS PI 6.5 STRL IVOR (GLOVE) ×2 IMPLANT
GOWN SPEC L3 XXLG W/TWL (GOWN DISPOSABLE) ×4 IMPLANT
GOWN STRL REUS W/TWL LRG LVL3 (GOWN DISPOSABLE) ×5 IMPLANT
HANDPIECE INTERPULSE COAX TIP (DISPOSABLE) ×3
IMMOBILIZER KNEE 20 (SOFTGOODS)
IMMOBILIZER KNEE 20 THIGH 36 (SOFTGOODS) IMPLANT
IV NS IRRIG 3000ML ARTHROMATIC (IV SOLUTION) ×2 IMPLANT
KIT BASIN OR (CUSTOM PROCEDURE TRAY) ×3 IMPLANT
MANIFOLD NEPTUNE II (INSTRUMENTS) ×3 IMPLANT
NS IRRIG 1000ML POUR BTL (IV SOLUTION) ×2 IMPLANT
PACK TOTAL JOINT (CUSTOM PROCEDURE TRAY) ×3 IMPLANT
POSITIONER SURGICAL ARM (MISCELLANEOUS) ×3 IMPLANT
SET HNDPC FAN SPRY TIP SCT (DISPOSABLE) IMPLANT
SPONGE GAUZE 2X2 STER 10/PKG (GAUZE/BANDAGES/DRESSINGS)
STAPLER VISISTAT 35W (STAPLE) ×2 IMPLANT
STRIP CLOSURE SKIN 1/2X4 (GAUZE/BANDAGES/DRESSINGS) ×2 IMPLANT
SUCTION FRAZIER 12FR DISP (SUCTIONS) ×2 IMPLANT
SUT ETHIBOND NAB CT1 #1 30IN (SUTURE) ×3 IMPLANT
SUT MNCRL AB 4-0 PS2 18 (SUTURE) ×3 IMPLANT
SUT VIC AB 1 CT1 36 (SUTURE) ×6 IMPLANT
SUT VIC AB 2-0 CT1 27 (SUTURE) ×6
SUT VIC AB 2-0 CT1 TAPERPNT 27 (SUTURE) ×2 IMPLANT
SUT VLOC 180 0 24IN GS25 (SUTURE) ×2 IMPLANT
TOWEL OR 17X26 10 PK STRL BLUE (TOWEL DISPOSABLE) ×6 IMPLANT
TRAY FOLEY CATH 14FRSI W/METER (CATHETERS) ×1 IMPLANT
WATER STERILE IRR 1500ML POUR (IV SOLUTION) ×2 IMPLANT

## 2013-11-28 NOTE — H&P (View-Only) (Signed)
   Subjective: 1 Day Post-Op Procedure(s) (LRB): REMOVAL OF HARDWARE RIGHT HIP AND RIGHT HEMI-ARTHROPLASTY  HIP  (Right)   Patient reports pain as moderate, pain controlled. No events throughout the night. Working on getting IV access today. Plan on surgery later today.  Objective:   VITALS:   Filed Vitals:   11/28/13 0552  BP: 110/70  Pulse: 96  Temp: 98.4 F (36.9 C)  Resp: 15    No cellulitis present Compartment soft  LABS  Recent Labs  11/27/13 0200 11/28/13 0445  HGB 11.6* 11.2*  HCT 35.4* 32.8*  WBC 6.8 5.7  PLT 149* 144*     Recent Labs  11/27/13 0200 11/28/13 0445  NA 130* 127*  K 3.6* 3.4*  BUN 12 9  CREATININE 0.55 0.39*  GLUCOSE 132* 89     Assessment/Plan: 1 Day Post-Op Procedure(s) (LRB): REMOVAL OF HARDWARE RIGHT HIP AND RIGHT HEMI-ARTHROPLASTY  HIP  (Right)   NPO now Surgery planned later today Will obtain IV access today, IR PICC consult placed.     Jalexis Breed S. Jamyia Fortune   PAC  11/28/2013, 9:07 AM   

## 2013-11-28 NOTE — Plan of Care (Signed)
Problem: Consults Goal: Hip/Femur Fracture Patient Education See Patient Education Module for education specifics.  Outcome: Not Met (add Reason) Memory impairment, confusion, unable to follow along, retain info.

## 2013-11-28 NOTE — Transfer of Care (Signed)
Immediate Anesthesia Transfer of Care Note  Patient: Melissa Bradley  Procedure(s) Performed: Procedure(s) (LRB): REMOVAL HARDWARE RIGHT HIP/ RIGHT ARTHROPLASTY BIPOLAR HIP (Right)  Patient Location: PACU  Anesthesia Type: General  Level of Consciousness: sedated, patient cooperative and responds to stimulation  Airway & Oxygen Therapy: Patient Spontanous Breathing and Patient connected to face mask oxgen  Post-op Assessment: Report given to PACU RN and Post -op Vital signs reviewed and stable  Post vital signs: Reviewed and stable  Complications: No apparent anesthesia complications

## 2013-11-28 NOTE — Interval H&P Note (Signed)
History and Physical Interval Note:  11/28/2013 6:11 PM  Melissa Bradley  has presented today for surgery, with the diagnosis of fractured right hip  The various methods of treatment have been discussed with the patient and family. After consideration of risks, benefits and other options for treatment, the patient has consented to  Procedure(s): ARTHROPLASTY BIPOLAR HIP (Right) as a surgical intervention .  The patient's history has been reviewed, patient examined, no change in status, stable for surgery.  I have reviewed the patient's chart and labs.  Questions were answered to the patient's satisfaction.     Shelda PalLIN,Oluwatobi Ruppe D

## 2013-11-28 NOTE — Anesthesia Postprocedure Evaluation (Signed)
Anesthesia Post Note  Patient: Melissa Bradley  Procedure(s) Performed: Procedure(s) (LRB): REMOVAL HARDWARE RIGHT HIP/ RIGHT ARTHROPLASTY BIPOLAR HIP (Right)  Anesthesia type: General  Patient location: PACU  Post pain: Pain level controlled  Post assessment: Post-op Vital signs reviewed  Last Vitals:  Filed Vitals:   11/28/13 2015  BP: 127/70  Pulse: 63  Temp:   Resp: 15    Post vital signs: Reviewed  Level of consciousness: sedated  Complications: No apparent anesthesia complications

## 2013-11-28 NOTE — Progress Notes (Signed)
   Subjective: 1 Day Post-Op Procedure(s) (LRB): REMOVAL OF HARDWARE RIGHT HIP AND RIGHT HEMI-ARTHROPLASTY  HIP  (Right)   Patient reports pain as moderate, pain controlled. No events throughout the night. Working on getting IV access today. Plan on surgery later today.  Objective:   VITALS:   Filed Vitals:   11/28/13 0552  BP: 110/70  Pulse: 96  Temp: 98.4 F (36.9 C)  Resp: 15    No cellulitis present Compartment soft  LABS  Recent Labs  11/27/13 0200 11/28/13 0445  HGB 11.6* 11.2*  HCT 35.4* 32.8*  WBC 6.8 5.7  PLT 149* 144*     Recent Labs  11/27/13 0200 11/28/13 0445  NA 130* 127*  K 3.6* 3.4*  BUN 12 9  CREATININE 0.55 0.39*  GLUCOSE 132* 89     Assessment/Plan: 1 Day Post-Op Procedure(s) (LRB): REMOVAL OF HARDWARE RIGHT HIP AND RIGHT HEMI-ARTHROPLASTY  HIP  (Right)   NPO now Surgery planned later today Will obtain IV access today, IR PICC consult placed.     Anastasio AuerbachMatthew S. Jameson Morrow   PAC  11/28/2013, 9:07 AM

## 2013-11-28 NOTE — Progress Notes (Signed)
[facility] PROGRESS NOTE  Charlott Hollerdsell Bigos ZOX:[id number] DOB: 11-17-1924 DOA: 11/27/2013 PCP: No primary provider on file.  Assessment/Plan: Active Problems:   Fracture of femoral neck, right, closed -  Ortho consulted and managing. Will defer further recommendations to them regarding this medical problem.    Unspecified essential hypertension - currently on metoprolol     Anxiety state, unspecified - stable    Seizures - stable, continue patient's home regimen dilantin  Hypokalemia - replace orally after operation   Code Status: fulll Family Communication: no family at bedside. Disposition Plan: Pending improvement in condition and recommendations from ortho   Consultants:  Ortho  Procedures:  pending  Antibiotics:  Rocephin  HPI/Subjective: No new complaints reported to me by patient.  Objective: Filed Vitals:   11/28/13 1343  BP: 121/62  Pulse: 61  Temp: 99.2 F (37.3 C)  Resp: 16    Intake/Output Summary (Last 24 hours) at 11/28/13 1408 Last data filed at 11/28/13 1000  Gross per 24 hour  Intake  757.5 ml  Output   2050 ml  Net -1292.5 ml   Filed Weights   11/27/13 0135 11/27/13 0225  Weight: 65.772 kg (145 lb) 65.772 kg (145 lb)    Exam:   General:  Pt in NAD, alert and awake  Cardiovascular: rrr, no mrg  Respiratory: cta bl, no wheezes  Abdomen: soft, NT, ND  Musculoskeletal: no clubbing   Data Reviewed: Basic Metabolic Panel:  Recent Labs Lab 11/27/13 0200 11/28/13 0445  NA 130* 127*  K 3.6* 3.4*  CL 94* 90*  CO2 25 25  GLUCOSE 132* 89  BUN 12 9  CREATININE 0.55 0.39*  CALCIUM 8.2* 8.3*  MG 1.7  --    Liver Function Tests: No results found for this basename: AST, ALT, ALKPHOS, BILITOT, PROT, ALBUMIN,  in the last 168 hours No results found for this basename: LIPASE, AMYLASE,  in the last 168 hours No results found for this basename: AMMONIA,  in the last 168 hours CBC:  Recent Labs Lab 11/27/13 0200  11/28/13 0445  WBC 6.8 5.7  NEUTROABS 5.1  --   HGB 11.6* 11.2*  HCT 35.4* 32.8*  MCV 93.7 90.4  PLT 149* 144*   Cardiac Enzymes: No results found for this basename: CKTOTAL, CKMB, CKMBINDEX, TROPONINI,  in the last 168 hours BNP (last 3 results) No results found for this basename: PROBNP,  in the last 8760 hours CBG: No results found for this basename: GLUCAP,  in the last 168 hours  Recent Results (from the past 240 hour(s))  SURGICAL PCR SCREEN     Status: None   Collection Time    11/27/13 10:52 AM      Result Value Ref Range Status   MRSA, PCR NEGATIVE  NEGATIVE Final   Staphylococcus aureus NEGATIVE  NEGATIVE Final   Comment:            The Xpert SA Assay (FDA     approved for NASAL specimens     in patients over 78 years of age),     is one component of     a comprehensive surveillance     program.  Test performance has     been validated by [facility] for patients greater     than or equal to 78 year old.     It is not intended     to diagnose infection nor to     guide or monitor treatment.  Studies: No results found.  Scheduled Meds: . cefTRIAXone (ROCEPHIN)  IV  1 g Intravenous Q24H  . docusate sodium  100 mg Oral BID  . metoprolol tartrate  50 mg Oral Daily   And  . metoprolol tartrate  25 mg Oral QHS  . pantoprazole  40 mg Oral Daily  . phenytoin  100 mg Oral TID   Continuous Infusions: . sodium chloride 75 mL/hr at 11/28/13 1107    Time spent: > 35 minutes    Penny Pia  [facility] Pager [phone]. If 7PM-7AM, please contact night-coverage at [email or web address], password [facility] 11/28/2013, 2:08 PM  LOS: 1 day

## 2013-11-29 ENCOUNTER — Encounter (HOSPITAL_COMMUNITY): Payer: Self-pay | Admitting: Orthopedic Surgery

## 2013-11-29 DIAGNOSIS — S72009A Fracture of unspecified part of neck of unspecified femur, initial encounter for closed fracture: Principal | ICD-10-CM

## 2013-11-29 LAB — BASIC METABOLIC PANEL
Anion gap: 10 (ref 5–15)
BUN: 13 mg/dL (ref 6–23)
CHLORIDE: 95 meq/L — AB (ref 96–112)
CO2: 25 meq/L (ref 19–32)
Calcium: 7.7 mg/dL — ABNORMAL LOW (ref 8.4–10.5)
Creatinine, Ser: 0.44 mg/dL — ABNORMAL LOW (ref 0.50–1.10)
GFR calc Af Amer: >90 mL/min (ref >90)
GFR calc non Af Amer: 88 mL/min — ABNORMAL LOW (ref >90)
GLUCOSE: 126 mg/dL — AB (ref 70–99)
POTASSIUM: 3.7 meq/L (ref 3.7–5.3)
Sodium: 130 mEq/L — ABNORMAL LOW (ref 137–147)

## 2013-11-29 LAB — CBC
HCT: 27.9 % — ABNORMAL LOW (ref 36.0–46.0)
HEMOGLOBIN: 9.2 g/dL — AB (ref 12.0–15.0)
MCH: 30.3 pg (ref 26.0–34.0)
MCHC: 33 g/dL (ref 30.0–36.0)
MCV: 91.8 fL (ref 78.0–100.0)
Platelets: 122 10*3/uL — ABNORMAL LOW (ref 150–400)
RBC: 3.04 MIL/uL — AB (ref 3.87–5.11)
RDW: 12.4 % (ref 11.5–15.5)
WBC: 6.2 10*3/uL (ref 4.0–10.5)

## 2013-11-29 MED ORDER — METOPROLOL TARTRATE 25 MG PO TABS
25.0000 mg | ORAL_TABLET | Freq: Two times a day (BID) | ORAL | Status: DC
  Administered 2013-11-30: 25 mg via ORAL
  Filled 2013-11-29 (×3): qty 1

## 2013-11-29 NOTE — Progress Notes (Signed)
OT Cancellation Note  Patient Details Name: Melissa Bradley MRN: [id number] DOB: 04-12-1925   Cancelled Treatment:    Reason Eval/Treat Not Completed: Pt is Medicare/Medicaid and current D/C plan is SNF. No apparent immediate acute care OT needs, therefore will defer OT to SNF. If OT eval is needed please call Acute Rehab Dept. at [phone]  [facility],Ashlynn Gunnels 11/29/2013, 3:00 PM Marica OtterMaryellen Misao Fackrell, OTR/L [phone] 11/29/2013

## 2013-11-29 NOTE — Progress Notes (Signed)
   Subjective: 1 Day Post-Op Procedure(s) (LRB): REMOVAL HARDWARE RIGHT HIP/ RIGHT ARTHROPLASTY BIPOLAR HIP (Right)   Patient reports pain as mild, pain controlled. States that the hip feels good, she has a little pain in the right foot. No other events throughout the night. Feels that she might not be stable to live alone, stating that she feels that she has done it long enough.  Objective:   VITALS:   Filed Vitals:   11/29/13 0451  BP: 106/50  Pulse: 63  Temp: 98.8 F (37.1 C)  Resp: 14    Dorsiflexion/Plantar flexion intact Incision: dressing C/D/I No cellulitis present Compartment soft  LABS  Recent Labs  11/27/13 0200 11/28/13 0445 11/29/13 0427  HGB 11.6* 11.2* 9.2*  HCT 35.4* 32.8* 27.9*  WBC 6.8 5.7 6.2  PLT 149* 144* 122*     Recent Labs  11/27/13 0200 11/28/13 0445 11/29/13 0427  NA 130* 127* 130*  K 3.6* 3.4* 3.7  BUN 12 9 13   CREATININE 0.55 0.39* 0.44*  GLUCOSE 132* 89 126*     Assessment/Plan: 1 Day Post-Op Procedure(s) (LRB): REMOVAL HARDWARE RIGHT HIP/ RIGHT ARTHROPLASTY BIPOLAR HIP (Right)  Up with therapy   Ortho recommendations:  Lovenox 40 mg qd, 14 days, (Rx written)  Percocet for pain management (Rx written).  MiraLax and Colace for constipation  Iron 325 mg tid for 2-3 weeks   PWB 50% on the right leg.  Change to Aquacel dressing prior to discharge.  Dressing to remain in place until follow in clinic in 2 weeks.  Dressing is waterproof and may shower with it in place.  [facility]. Follow up with OLIN,Lynessa Almanzar D in 2 weeks.  Contact information:  [facility] [address], [address] [place] [id number] [phone]         Anastasio AuerbachMatthew S. Lonnie Rosado   PAC  11/29/2013, 8:27 AM

## 2013-11-29 NOTE — Evaluation (Signed)
Physical Therapy Evaluation Patient Details Name: Melissa Bradley MRN: [id number] DOB: December 08, 1924 Today's Date: 11/29/2013   History of Present Illness  80 yo female adm after afall at home resulting in right hip fx, underwent hardware removal adn right hip hemiarthroplasty on 11/28/13 per Dr Charlann Boxerlin; PMHx: HTN, SZs, CAD, hip surgery/fx  Clinical Impression  Pt will benefit from PT to address deficits below; she is quite deconditioned and at risk for further falls;  will need SNF at D/C.     Follow Up Recommendations SNF;Supervision/Assistance - 24 hour    Equipment Recommendations  None recommended by PT    Recommendations for Other Services       Precautions / Restrictions Precautions Precautions: Fall Restrictions RLE Weight Bearing: Partial weight bearing RLE Partial Weight Bearing Percentage or Pounds: 50%      Mobility  Bed Mobility Overal bed mobility: +2 for physical assistance;Needs Assistance Bed Mobility: Sit to Supine;Rolling Rolling: Mod assist     Sit to supine: +2 for physical assistance;Max assist   General bed mobility comments: +2 for LEs, trunk, bed pad used for positionong  Transfers Overall transfer level: Needs assistance Equipment used: Rolling walker (2 wheeled) Transfers: Sit to/from BJ'sStand;Stand Pivot Transfers (x3) Sit to Stand: +2 physical assistance;Max assist;Total assist Stand pivot transfers: +2 physical assistance;Total assist       General transfer comment: +2 for wt sift to stand, pt with varying amount of effort with multiple trials, knees buckling in standing and pt unable to maintain standing due to knees buckling, very fearful of falling  Ambulation/Gait                Stairs            Wheelchair Mobility    Modified Rankin (Stroke Patients Only)       Balance Overall balance assessment: Needs assistance;History of Falls         Standing balance support: Bilateral upper extremity supported Standing  balance-Leahy Scale: Zero                               Pertinent Vitals/Pain C/o right hip pain, not rated    Home Living Family/patient expects to be discharged to:: Skilled nursing facility Living Arrangements: Alone             Home Equipment: Walker - 2 wheels;Bedside commode      Prior Function Level of Independence: Independent with assistive device(s)         Comments: per pt she was struggling more lately with ADLs and mobility prior to fall      Hand Dominance        Extremity/Trunk Assessment   Upper Extremity Assessment: Generalized weakness           Lower Extremity Assessment: Generalized weakness;RLE deficits/detail RLE Deficits / Details: grossly 3/5;     Cervical / Trunk Assessment: Kyphotic  Communication   Communication: No difficulties  Cognition Arousal/Alertness: Awake/alert Behavior During Therapy: Restless;Anxious;Impulsive Overall Cognitive Status: Impaired/Different from baseline Area of Impairment: Following commands;Safety/judgement;Problem solving     Memory: Decreased recall of precautions Following Commands: Follows one step commands inconsistently     Problem Solving: Difficulty sequencing;Requires verbal cues;Requires tactile cues      General Comments      Exercises        Assessment/Plan    PT Assessment Patient needs continued PT services  PT Diagnosis Difficulty walking;Generalized weakness   PT Problem  List Decreased strength;Decreased activity tolerance;Decreased balance;Decreased mobility;Decreased knowledge of precautions;Decreased knowledge of use of DME  PT Treatment Interventions DME instruction;Gait training;Functional mobility training;Therapeutic activities;Therapeutic exercise;Patient/family education;Balance training   PT Goals (Current goals can be found in the Care Plan section) Acute Rehab PT Goals Patient Stated Goal: to walk again PT Goal Formulation: With patient Time For  Goal Achievement: 12/06/13 Potential to Achieve Goals: Good    Frequency Min 3X/week   Barriers to discharge        Co-evaluation               End of Session Equipment Utilized During Treatment: Gait belt Activity Tolerance: Patient limited by pain;Patient limited by fatigue Patient left: in bed;with call bell/phone within reach;with bed alarm set;with nursing/sitter in room Nurse Communication: Mobility status         Time: 1435-1458 PT Time Calculation (min): 23 min   Charges:   PT Evaluation $Initial PT Evaluation Tier I: 1 Procedure PT Treatments $Therapeutic Activity: 23-37 mins   PT G Codes:          Jeiden Daughtridge December 15, 2013, 4:28 PM

## 2013-11-29 NOTE — Progress Notes (Signed)
Clinical Social Work Department BRIEF PSYCHOSOCIAL ASSESSMENT 11/29/2013  Patient:  Melissa Bradley,Melissa Bradley     Account Number:  [id number]     Admit date:  11/27/2013  Clinical Social Worker:  Candie ChromanHAIDINGER,Micco Bourbeau, LCSW  Date/Time:  11/29/2013 02:25 PM  Referred by:  RN  Date Referred:  11/29/2013 Referred for  SNF Placement   Other Referral:   Interview type:  Patient Other interview type:    PSYCHOSOCIAL DATA Living Status:  ALONE Admitted from facility:   Level of care:   Primary support name:  Hazel Primary support relationship to patient:  CHILD, ADULT Degree of support available:   supportive    CURRENT CONCERNS Current Concerns  Post-Acute Placement   Other Concerns:    SOCIAL WORK ASSESSMENT / PLAN Pt is an 80 yr old female living at home prior to hospitalization. Pt fell at home which resulted in a hip fx. Pt had surgery on 7/22 and will require rehab following hospital d/c. Pt is from [place] . She is requesting [facility] in [place]. Pt plans to return to [place] following her rehab stay. SNF search has been initiated and bed offers are pending. CSW has requested an out of state PASRR. Decision is pending.   Assessment/plan status:  Psychosocial Support/Ongoing Assessment of Needs Other assessment/ plan:   Information/referral to community resources:   Insurance coverage for SNF and ambulance transport reviewed. PASRR info reviewed. SNF list provided.    PATIENT'S/FAMILY'S RESPONSE TO PLAN OF CARE: Pt prefers to have her rehab in [place]. Family understands that a PASRR # is needed for SNF placement in . Pt is looking forward to rehab. Pt/family appreciate assistance provided by CSW.   Cori RazorJamie Onnie Alatorre LCSW [phone]

## 2013-11-29 NOTE — Brief Op Note (Signed)
11/28/13  PATIENT:  Melissa Bradley  80 y.o. female  PRE-OPERATIVE DIAGNOSIS:  Right femoral neck fracture with retained femoral hardware from previous femur surgery   POST-OPERATIVE DIAGNOSIS:  Right femoral neck fracture with retained femoral hardware from previous femur surgery  PROCEDURE:  Procedure(s): REMOVAL HARDWARE RIGHT HIP/  RIGHT hip hemiARTHROPLASTY Unipolar HIP (Right)  SURGEON:  Surgeon(s) and Role:    * Shelda PalMatthew D Dreshon Proffit, MD - Primary  PHYSICIAN ASSISTANT: Lanney GinsMatthew Babish, PA-C  ANESTHESIA:   general  EBL:   About 200cc  BLOOD ADMINISTERED:none  DRAINS: none   LOCAL MEDICATIONS USED:  NONE  SPECIMEN:  No Specimen  DISPOSITION OF SPECIMEN:  N/A  COUNTS:  YES  TOURNIQUET:  * No tourniquets in log *  DICTATION: .Other Dictation: Dictation Number [id number]  PLAN OF CARE: Admit to inpatient   PATIENT DISPOSITION:  PACU - hemodynamically stable.   Delay start of Pharmacological VTE agent (>24hrs) due to surgical blood loss or risk of bleeding: no

## 2013-11-29 NOTE — Progress Notes (Signed)
Clinical Social Work Department BRIEF PSYCHOSOCIAL ASSESSMENT 11/29/2013  Patient:  Melissa Bradley,Melissa Bradley     Account Number:  [id number]     Admit date:  11/27/2013  Clinical Social Worker:  Tobechukwu Emmick, LCSW  Date/Time:  11/29/2013 02:25 PM  Referred by:  RN  Date Referred:  11/29/2013 Referred for  SNF Placement   Other Referral:   Interview type:  Patient Other interview type:    PSYCHOSOCIAL DATA Living Status:  ALONE Admitted from facility:   Level of care:   Primary support name:  Hazel Primary support relationship to patient:  CHILD, ADULT Degree of support available:   supportive    CURRENT CONCERNS Current Concerns  Post-Acute Placement   Other Concerns:    SOCIAL WORK ASSESSMENT / PLAN Pt is an 80 yr old female living at home prior to hospitalization. Pt fell at home which resulted in a hip fx. Pt had surgery on 7/22 and will require rehab following hospital d/c. [facility] [place]. [facility] stay. SNF search has been initiated and bed offers are pending. CSW has requested an out of state PASRR. Decision is pending.   Assessment/plan status:  Psychosocial Support/Ongoing Assessment of Needs Other assessment/ plan:   Information/referral to community resources:   Insurance coverage for SNF and ambulance transport reviewed. PASRR info reviewed. SNF list provided.    PATIENT'S/FAMILY'S RESPONSE TO PLAN OF CARE: Pt prefers to have her rehab in [place]. Family understands that a PASRR # is needed for SNF placement in [place]. Pt is looking forward to rehab. Pt/family appreciate assistance provided by CSW.   Jalena Vanderlinden LCSW [phone]     

## 2013-11-29 NOTE — Progress Notes (Signed)
[facility] PROGRESS NOTE  Melissa Bradley ZOX:[id number] DOB: January 06, 1925 DOA: 11/27/2013 PCP: No primary provider on file.  Assessment/Plan: Active Problems:   Fracture of femoral neck, right, closed -  Ortho consulted and managing.  - defer prophylactic DVT and pain management to them. - PT on board    Unspecified essential hypertension - currently on metoprolol. Will decrease metoprolol dose given softer blood pressures. Would hold if SBP < 100 or HR < 60    Anxiety state, unspecified - stable    Seizures - stable, continue patient's home regimen dilantin  Hypokalemia - resolved.  Code Status: fulll Family Communication: no family at bedside. Disposition Plan: Pending improvement in condition and recommendations from ortho   Consultants:  Ortho  Procedures:  pending  Antibiotics:  Rocephin  HPI/Subjective: No new complaints reported to me by patient. Pain currently tolerated.  Objective: Filed Vitals:   11/29/13 1200  BP:   Pulse:   Temp:   Resp: 18    Intake/Output Summary (Last 24 hours) at 11/29/13 1415 Last data filed at 11/29/13 1227  Gross per 24 hour  Intake   3920 ml  Output    775 ml  Net   3145 ml   Filed Weights   11/27/13 0135 11/27/13 0225  Weight: 65.772 kg (145 lb) 65.772 kg (145 lb)    Exam:   General:  Pt in NAD, alert and awake  Cardiovascular: rrr, no mrg  Respiratory: cta bl, no wheezes  Abdomen: soft, NT, ND  Musculoskeletal: no clubbing   Data Reviewed: Basic Metabolic Panel:  Recent Labs Lab 11/27/13 0200 11/28/13 0445 11/29/13 0427  NA 130* 127* 130*  K 3.6* 3.4* 3.7  CL 94* 90* 95*  CO2 25 25 25   GLUCOSE 132* 89 126*  BUN 12 9 13   CREATININE 0.55 0.39* 0.44*  CALCIUM 8.2* 8.3* 7.7*  MG 1.7  --   --    Liver Function Tests: No results found for this basename: AST, ALT, ALKPHOS, BILITOT, PROT, ALBUMIN,  in the last 168 hours No results found for this basename: LIPASE, AMYLASE,  in the last  168 hours No results found for this basename: AMMONIA,  in the last 168 hours CBC:  Recent Labs Lab 11/27/13 0200 11/28/13 0445 11/29/13 0427  WBC 6.8 5.7 6.2  NEUTROABS 5.1  --   --   HGB 11.6* 11.2* 9.2*  HCT 35.4* 32.8* 27.9*  MCV 93.7 90.4 91.8  PLT 149* 144* 122*   Cardiac Enzymes: No results found for this basename: CKTOTAL, CKMB, CKMBINDEX, TROPONINI,  in the last 168 hours BNP (last 3 results) No results found for this basename: PROBNP,  in the last 8760 hours CBG: No results found for this basename: GLUCAP,  in the last 168 hours  Recent Results (from the past 240 hour(s))  SURGICAL PCR SCREEN     Status: None   Collection Time    11/27/13 10:52 AM      Result Value Ref Range Status   MRSA, PCR NEGATIVE  NEGATIVE Final   Staphylococcus aureus NEGATIVE  NEGATIVE Final   Comment:            The Xpert SA Assay (FDA     approved for NASAL specimens     in patients over 66 years of age),     is one component of     a comprehensive surveillance     program.  Test performance has     been validated by [employer]  [facility] for patients greater     than or equal to 78 year old.     It is not intended     to diagnose infection nor to     guide or monitor treatment.     Studies: Dg Pelvis Portable  11/28/2013   CLINICAL DATA:  Right hip replacement.  EXAM: PORTABLE PELVIS 1-2 VIEWS  COMPARISON:  12/12/2006.  12/06/2006.  FINDINGS: Portable AP view of the lower pelvis at 2025 hrs shows the patient to be status post right hip hemiarthroplasty. There is been extensive ORIF of the mid femur and some of the proximal screws related to the plates present previously have been removed in the interval. No evidence for immediate hardware complications. Gas in the overlying soft tissues is compatible with the recent surgery.  IMPRESSION: Status post right hip hemiarthroplasty. No evidence for immediate hardware complications.   Electronically Signed   By: Kennith CenterEric  Mansell M.D.   On:  11/28/2013 20:39    Scheduled Meds: . cefTRIAXone (ROCEPHIN)  IV  1 g Intravenous Q24H  . docusate sodium  100 mg Oral BID  . enoxaparin (LOVENOX) injection  40 mg Subcutaneous Q24H  . ferrous sulfate  325 mg Oral TID PC  . metoprolol tartrate  50 mg Oral Daily   And  . metoprolol tartrate  25 mg Oral QHS  . pantoprazole  40 mg Oral Daily  . phenytoin  100 mg Oral TID  . potassium chloride  40 mEq Oral Once   Continuous Infusions: . sodium chloride 75 mL/hr at 11/29/13 0540    Time spent: > 35 minutes    [name], [name]  [facility] Pager [phone]. If 7PM-7AM, please contact night-coverage at [email or web address], password [email or web address] 11/29/2013, 2:15 PM  LOS: 2 days

## 2013-11-30 LAB — BASIC METABOLIC PANEL
Anion gap: 11 (ref 5–15)
BUN: 16 mg/dL (ref 6–23)
CHLORIDE: 93 meq/L — AB (ref 96–112)
CO2: 23 mEq/L (ref 19–32)
Calcium: 7.4 mg/dL — ABNORMAL LOW (ref 8.4–10.5)
Creatinine, Ser: 0.48 mg/dL — ABNORMAL LOW (ref 0.50–1.10)
GFR calc Af Amer: >90 mL/min (ref >90)
GFR calc non Af Amer: 86 mL/min — ABNORMAL LOW (ref >90)
Glucose, Bld: 127 mg/dL — ABNORMAL HIGH (ref 70–99)
POTASSIUM: 3.6 meq/L — AB (ref 3.7–5.3)
Sodium: 127 mEq/L — ABNORMAL LOW (ref 137–147)

## 2013-11-30 LAB — CBC
HEMATOCRIT: 20.8 % — AB (ref 36.0–46.0)
HEMOGLOBIN: 7.5 g/dL — AB (ref 12.0–15.0)
MCH: 32.9 pg (ref 26.0–34.0)
MCHC: 36.1 g/dL — ABNORMAL HIGH (ref 30.0–36.0)
MCV: 91.2 fL (ref 78.0–100.0)
Platelets: 119 10*3/uL — ABNORMAL LOW (ref 150–400)
RBC: 2.28 MIL/uL — ABNORMAL LOW (ref 3.87–5.11)
RDW: 12.6 % (ref 11.5–15.5)
WBC: 8.1 10*3/uL (ref 4.0–10.5)

## 2013-11-30 MED ORDER — SODIUM CHLORIDE 0.9 % IV BOLUS (SEPSIS): 500.0000 mL | Freq: Once | INTRAVENOUS | Status: DC

## 2013-11-30 MED ORDER — SODIUM CHLORIDE 0.9 % IV BOLUS (SEPSIS)
500.0000 mL | Freq: Once | INTRAVENOUS | Status: AC
  Administered 2013-11-30: 500 mL via INTRAVENOUS

## 2013-11-30 NOTE — Op Note (Signed)
Melissa Bradley, Melissa Bradley             ACCOUNT NO.:  [id number]  MEDICAL RECORD NO.:  [id number]  LOCATION:  1601                         FACILITY:  [facility]  PHYSICIAN:  Madlyn Frankel. Charlann Bradley, M.D.  DATE OF BIRTH:  1925/03/18  DATE OF PROCEDURE:  11/28/2013 DATE OF DISCHARGE:                              OPERATIVE REPORT   PREOPERATIVE DIAGNOSIS:  Right femoral neck fracture in setting of previous right femur, open reduction and internal fixation with retained hardware.  POSTOPERATIVE DIAGNOSIS:  Right femoral neck fracture in setting of previous right femur, open reduction and internal fixation with retained hardware.  PROCEDURE: 1. Removal of deep implant, 3 screws removed from distal plate screws. 2. Right hip hemiarthroplasty.  COMPONENTS USED:  DePuy size 5 standard Tri Lock stem with a 47 unipolar ball and a 0 adapter.  SURGEON:  Madlyn Frankel. Charlann Bradley, M.D.  ASSISTANT:  Melissa Gins, PA.  ANESTHESIA:  General.  SPECIMENS:  None.  COMPLICATION:  None.  DRAINS:  None.  BLOOD LOSS:  About 200-250 mL.  INDICATION FOR PROCEDURE:  Melissa Bradley is an 80 year old.  The patient known to me from previous surgical intervention managing distal femoral as well as femoral periprosthetic injuries in the past.  She has had a previous right distal femoral replacing total knee arthroplasty in addition to open reduction and internal fixation of the periprosthetic fracture of the femur.  She had not been seen in some time, but presented to the [facility] Emergency Room, and subsequently admitted to [facility] with a right femoral neck fracture.  Based on her interventions in [place], they wished for her to be transferred down when accepted transfer.  She was admitted to [facility] on November 27, 2013, we were unable to get adequate IV access.  So, the case was postponed until the next day.  Risks and benefits have been reviewed with family and patient on the necessity of the  procedure and planned procedure.  Consent was obtained for benefit of management of her fracture.  PROCEDURE IN DETAIL:  The patient was brought to the operative theater. Once adequate anesthesia, preoperative antibiotics, Ancef administered. She was positioned into the left lateral decubitus position right side up.  Right lower extremity prescrubbed and prepped and draped in a sterile fashion.  Time-out was performed identifying the patient, planned procedure, and extremity.  Lateral based incision was demarcated on the skin, extending into the lateral based incision from previous placed procedures.  I extended incision distally, so I could identify the plate laterally and I removed 2 screws from the lateral plate and 1 from anterior plate. Once the screw was removed, I was able to now attended the femoral neck portion of the case.  The posterior aspect the hip was exposed through a standard posterior approach by taking down the short external rotators as well as the posterior capsule.  The femoral neck fracture was readily identified. Hematoma evacuated  The femur was then internally rotated and flexed, and the femoral head was removed and measured on the back table to be 47 mm in diameter.  The neck osteotomy was made using oscillating saw, into the trochanteric fossa.  The proximal femur was then opened with  a starting drill, hand reamed once and irrigated to try to prevent fat emboli.  I then began broaching with a starting broach and then broached up to a size 5 with good proximal medial and lateral fit.  A trial reduction was carried out with standard neck, 47 ball and 0 adapter.  I found that her leg was stable throughout range of motion without evidence of impingement, no subluxation.  Leg lengths appeared to be equal compared to the down leg position.  Given these findings, the trial components removed.  The hip was irrigated.  The final size 5 standard stem was opened  and impacted to level where the broach was.  Based on this, the trial reduction 0 adapter was selected and impacted into the 47 unipolar ball.  This was then impacted on a clean and dry trunnion, and the hip was reduced.  At this point, I reapproximated the posterior capsule using #1 Vicryl. I then reapproximated the iliotibial band from its distal extent from hardware removal through the gluteal fascia posteriorly. The remainder of the wound was then closed with 2-0 Vicryl and then stapled on the skin based on the length of the incision.  We utilized the long Mepilex dressing.  The patient was then brought to recovery room in stable condition, tolerating the procedure well.  She will be admitted to the hospital and discharge planning set forth.     Madlyn FrankelMatthew D. Charlann Boxerlin, M.D.     MDO/MEDQ  D:  11/29/2013  T:  11/30/2013  Job:  [id number]

## 2013-11-30 NOTE — Progress Notes (Signed)
   Subjective: 2 Days Post-Op Procedure(s) (LRB): REMOVAL HARDWARE RIGHT HIP/ RIGHT ARTHROPLASTY BIPOLAR HIP (Right)   Patient reports pain as mild, pain controlled. No events throughout the night.  Objective:   VITALS:   Filed Vitals:   11/30/13 0459  BP: 100/53  Pulse: 87  Temp: 98.4 F (36.9 C)  Resp: 18    Incision: scant drainage No cellulitis present Compartment soft  LABS  Recent Labs  11/28/13 0445 11/29/13 0427 11/30/13 0420  HGB 11.2* 9.2* 7.5*  HCT 32.8* 27.9* 20.8*  WBC 5.7 6.2 8.1  PLT 144* 122* 119*     Recent Labs  11/28/13 0445 11/29/13 0427 11/30/13 0630  NA 127* 130* 127*  K 3.4* 3.7 3.6*  BUN 9 13 16   CREATININE 0.39* 0.44* 0.48*  GLUCOSE 89 126* 127*     Assessment/Plan: 2 Days Post-Op Procedure(s) (LRB): REMOVAL HARDWARE RIGHT HIP/ RIGHT ARTHROPLASTY BIPOLAR HIP (Right)  Up with therapy Discharge to SNF eventually. Possibly Saturday if ready medically  Ortho recommendations:  Lovenox 40 mg qd, 14 days, (Rx written)  Percocet for pain management (Rx written).  MiraLax and Colace for constipation  Iron 325 mg tid for 2-3 weeks  PWB 50% on the right leg.  Change to 14" Aquacel dressing prior to discharge.  Dressing to remain in place until follow in clinic in 2 weeks.  Dressing is waterproof and may shower with it in place.  [facility]. Follow up with OLIN,Majestic Brister D in 2 weeks.   Contact information:   [facility]  [address], [address]  [place] [id number]  [phone]      Anastasio AuerbachMatthew S. Aleta Manternach   PAC  11/30/2013, 8:34 AM

## 2013-11-30 NOTE — Progress Notes (Signed)
Notified [facility] about pt having urinary output of . BMP had to be redrawn due to hemolysis. 500cc NS bolus ordered to be given over 2 hours. Will continue to monitor.

## 2013-11-30 NOTE — Progress Notes (Signed)
[facility] PROGRESS NOTE  Melissa Bradley ZOX:[id number] DOB: [birth year] DOA: 11/27/2013 PCP: No primary provider on file.  Assessment/Plan: Active Problems:   Fracture of femoral neck, right, closed -  Ortho consulted and managing.  - defer prophylactic DVT and pain management to them. - PT on board and patient will require SNF    Unspecified essential hypertension - soft blood pressures as such will d/c B blocker    Anxiety state, unspecified - stable    Seizures - stable, continue patient's home regimen dilantin  Hypokalemia - resolved.  Hyponatremia - Will place on normal saline and reassess next  - reassess BMP next am.  Code Status: fulll Family Communication: no family at bedside. Disposition Plan: Pending improvement in condition and recommendations from ortho   Consultants:  Ortho  Procedures:  pending  Antibiotics:  Rocephin  HPI/Subjective: Patient had a low blood pressure reported this morning. No new complaints. Patient's main concern this morning was that I help reposition her.  Objective: Filed Vitals:   11/30/13 1321  BP: 95/56  Pulse: 78  Temp: 100.4 F (38 C)  Resp: 16    Intake/Output Summary (Last 24 hours) at 11/30/13 1408 Last data filed at 11/30/13 1322  Gross per 24 hour  Intake 2382.5 ml  Output    711 ml  Net 1671.5 ml   Filed Weights   11/27/13 0135 11/27/13 0225  Weight: 65.772 kg (145 lb) 65.772 kg (145 lb)    Exam:   General:  Pt in NAD, alert and awake  Cardiovascular: rrr, no mrg  Respiratory: cta bl, no wheezes  Abdomen: soft, NT, ND  Musculoskeletal: no clubbing   Data Reviewed: Basic Metabolic Panel:  Recent Labs Lab 11/27/13 0200 11/28/13 0445 11/29/13 0427 11/30/13 0630  NA 130* 127* 130* 127*  K 3.6* 3.4* 3.7 3.6*  CL 94* 90* 95* 93*  CO2 25 25 25 23   GLUCOSE 132* 89 126* 127*  BUN 12 9 13 16   CREATININE 0.55 0.39* 0.44* 0.48*  CALCIUM 8.2* 8.3* 7.7* 7.4*  MG 1.7  --   --    --    Liver Function Tests: No results found for this basename: AST, ALT, ALKPHOS, BILITOT, PROT, ALBUMIN,  in the last 168 hours No results found for this basename: LIPASE, AMYLASE,  in the last 168 hours No results found for this basename: AMMONIA,  in the last 168 hours CBC:  Recent Labs Lab 11/27/13 0200 11/28/13 0445 11/29/13 0427 11/30/13 0420  WBC 6.8 5.7 6.2 8.1  NEUTROABS 5.1  --   --   --   HGB 11.6* 11.2* 9.2* 7.5*  HCT 35.4* 32.8* 27.9* 20.8*  MCV 93.7 90.4 91.8 91.2  PLT 149* 144* 122* 119*   Cardiac Enzymes: No results found for this basename: CKTOTAL, CKMB, CKMBINDEX, TROPONINI,  in the last 168 hours BNP (last 3 results) No results found for this basename: PROBNP,  in the last 8760 hours CBG: No results found for this basename: GLUCAP,  in the last 168 hours  Recent Results (from the past 240 hour(s))  SURGICAL PCR SCREEN     Status: None   Collection Time    11/27/13 10:52 AM      Result Value Ref Range Status   MRSA, PCR NEGATIVE  NEGATIVE Final   Staphylococcus aureus NEGATIVE  NEGATIVE Final   Comment:            The Xpert SA Assay (FDA     approved for NASAL specimens  in patients over 78 years of age),     is one component of     a comprehensive surveillance     program.  Test performance has     been validated by [employer]     [facility] for patients greater     than or equal to 355 year old.     It is not intended     to diagnose infection nor to     guide or monitor treatment.     Studies: Dg Pelvis Portable  11/28/2013   CLINICAL DATA:  Right hip replacement.  EXAM: PORTABLE PELVIS 1-2 VIEWS  COMPARISON:  12/12/2006.  12/06/2006.  FINDINGS: Portable AP view of the lower pelvis at 2025 hrs shows the patient to be status post right hip hemiarthroplasty. There is been extensive ORIF of the mid femur and some of the proximal screws related to the plates present previously have been removed in the interval. No evidence for immediate hardware  complications. Gas in the overlying soft tissues is compatible with the recent surgery.  IMPRESSION: Status post right hip hemiarthroplasty. No evidence for immediate hardware complications.   Electronically Signed   By: Kennith CenterEric  Mansell M.D.   On: 11/28/2013 20:39    Scheduled Meds: . cefTRIAXone (ROCEPHIN)  IV  1 g Intravenous Q24H  . docusate sodium  100 mg Oral BID  . enoxaparin (LOVENOX) injection  40 mg Subcutaneous Q24H  . ferrous sulfate  325 mg Oral TID PC  . pantoprazole  40 mg Oral Daily  . phenytoin  100 mg Oral TID  . potassium chloride  40 mEq Oral Once   Continuous Infusions: . sodium chloride 75 mL/hr at 11/29/13 2206    Time spent: > 35 minutes    [name], Anasia Agro  [facility] Pager [phone]. If 7PM-7AM, please contact night-coverage at [email or web address], password [email or web address] 11/30/2013, 2:08 PM  LOS: 3 days

## 2013-11-30 NOTE — Progress Notes (Signed)
Notified [facility] about pt BP of 77/57 HR 70. 500cc NS bolus ordered. Will continue to monitor.

## 2013-11-30 NOTE — Progress Notes (Signed)
CSW assisting with d/c planning. MD contacted. Pt is not stable for d/c today. [facility] / Daughter have been updated. Weekend CSW will assist with d/c to SNF if stable.  Cori RazorJamie Nakayla Rorabaugh LCSW [phone]

## 2013-11-30 NOTE — Progress Notes (Signed)
[facility] this weekend if medically stable. PASRR # has been received. Pt's daughter, Jerrye BeaversHazel [phone]( [phone] ) has been updated. CSW will alert family when d/c date has been determined.  Cori RazorJamie Antino Mayabb LCSW [phone]

## 2013-11-30 NOTE — Progress Notes (Signed)
CSW assisting with d/c planning. Bed offers have been provided to pt / family. [facility]. SNF will have an opening on SAT if pt is ready for d/c. CSW has requested a PASRR # ( out of state ). This # is pending. Pt/ family are aware [facility] not [facility] without [facility]. [place] in [place] has also been initiated and bed offers will be provided today. Pt will have to chose SNF in [place] if PASRR # is not available when pt is ready for d/c. CSW will continue to follow to assist with d/c planning to SNF.  Cori RazorJamie Anntionette Madkins  LCSW [phone]

## 2013-12-01 ENCOUNTER — Inpatient Hospital Stay (HOSPITAL_COMMUNITY): Payer: Medicare Other

## 2013-12-01 LAB — URINALYSIS, ROUTINE W REFLEX MICROSCOPIC
Bilirubin Urine: NEGATIVE
GLUCOSE, UA: NEGATIVE mg/dL
HGB URINE DIPSTICK: NEGATIVE
Ketones, ur: NEGATIVE mg/dL
Nitrite: NEGATIVE
Protein, ur: NEGATIVE mg/dL
SPECIFIC GRAVITY, URINE: 1.02 (ref 1.005–1.030)
UROBILINOGEN UA: 1 mg/dL (ref 0.0–1.0)
pH: 5.5 (ref 5.0–8.0)

## 2013-12-01 LAB — CBC
HCT: 17.3 % — ABNORMAL LOW (ref 36.0–46.0)
HEMOGLOBIN: 5.9 g/dL — AB (ref 12.0–15.0)
MCH: 31.2 pg (ref 26.0–34.0)
MCHC: 34.1 g/dL (ref 30.0–36.0)
MCV: 91.5 fL (ref 78.0–100.0)
Platelets: 117 10*3/uL — ABNORMAL LOW (ref 150–400)
RBC: 1.89 MIL/uL — ABNORMAL LOW (ref 3.87–5.11)
RDW: 12.6 % (ref 11.5–15.5)
WBC: 8.1 10*3/uL (ref 4.0–10.5)

## 2013-12-01 LAB — BASIC METABOLIC PANEL
ANION GAP: 9 (ref 5–15)
BUN: 13 mg/dL (ref 6–23)
CHLORIDE: 98 meq/L (ref 96–112)
CO2: 24 mEq/L (ref 19–32)
Calcium: 7.5 mg/dL — ABNORMAL LOW (ref 8.4–10.5)
Creatinine, Ser: 0.46 mg/dL — ABNORMAL LOW (ref 0.50–1.10)
GFR calc Af Amer: >90 mL/min (ref >90)
GFR calc non Af Amer: 87 mL/min — ABNORMAL LOW (ref >90)
Glucose, Bld: 114 mg/dL — ABNORMAL HIGH (ref 70–99)
Potassium: 3.9 mEq/L (ref 3.7–5.3)
Sodium: 131 mEq/L — ABNORMAL LOW (ref 137–147)

## 2013-12-01 LAB — HEMOGLOBIN AND HEMATOCRIT, BLOOD
HCT: 23.3 % — ABNORMAL LOW (ref 36.0–46.0)
Hemoglobin: 8.1 g/dL — ABNORMAL LOW (ref 12.0–15.0)

## 2013-12-01 LAB — URINE MICROSCOPIC-ADD ON

## 2013-12-01 LAB — PREPARE RBC (CROSSMATCH)

## 2013-12-01 NOTE — Progress Notes (Addendum)
Per RN, Pt not ready for d/c today.  Numerous calls to [facility].  No answer at either RN station.  No answer at the RN director extension.  CSW to continue to follow.  Providence CrosbyAmanda Damieon Armendariz, LCSW Clinical Social Work [phone]

## 2013-12-01 NOTE — Progress Notes (Signed)
Subjective: 3 Days Post-Op Procedure(s) (LRB): REMOVAL HARDWARE RIGHT HIP/ RIGHT ARTHROPLASTY BIPOLAR HIP (Right) Patient reports pain as 2 on 0-10 scale.    Objective: Vital signs in last 24 hours: Temp:  [98.8 F (37.1 C)-101.4 F (38.6 C)] 98.8 F (37.1 C) (07/25 0529) Pulse Rate:  [69-78] 69 (07/25 0529) Resp:  [16-17] 17 (07/25 0529) BP: (91-101)/(45-56) 93/51 mmHg (07/25 0529) SpO2:  [95 %-98 %] 98 % (07/25 0529)  Intake/Output from previous day: 07/24 0701 - 07/25 0700 In: 720 [P.O.:720] Out: 1100 [Urine:1100] Intake/Output this shift:     Recent Labs  11/29/13 0427 11/30/13 0420 12/01/13 0455  HGB 9.2* 7.5* 5.9*    Recent Labs  11/30/13 0420 12/01/13 0455  WBC 8.1 8.1  RBC 2.28* 1.89*  HCT 20.8* 17.3*  PLT 119* 117*    Recent Labs  11/30/13 0630 12/01/13 0455  NA 127* 131*  K 3.6* 3.9  CL 93* 98  CO2 23 24  BUN 16 13  CREATININE 0.48* 0.46*  GLUCOSE 127* 114*  CALCIUM 7.4* 7.5*   No results found for this basename: LABPT, INR,  in the last 72 hours  Neurologically intact Neurovascular intact Dorsiflexion/Plantar flexion intact Incision: dressing C/D/I and no drainage  Assessment/Plan: 3 Days Post-Op Procedure(s) (LRB): REMOVAL HARDWARE RIGHT HIP/ RIGHT ARTHROPLASTY BIPOLAR HIP (Right) Advance diet Up with therapy  Melissa Bradley C 12/01/2013, 8:04 AM

## 2013-12-01 NOTE — Progress Notes (Addendum)
Message from [place], Environmental education officerN supervisor at [facility]  Per [place] employee, Cathy aware that Pt not ready for d/c today.  Providence CrosbyAmanda Dorethy Tomey, LCSW Clinical Social Work [phone]

## 2013-12-01 NOTE — Progress Notes (Signed)
[facility] PROGRESS NOTE  Melissa Bradley WUJ:[id number] DOB: 1924-09-15 DOA: 11/27/2013 PCP: No primary provider on file.  Assessment/Plan: Active Problems:   Fracture of femoral neck, right, closed -  Ortho consulted and managing.  - defer prophylactic DVT and pain management to them. - PT on board and patient will require SNF  Fever - will obtain chest x ray, urinalysis, urine culture, blood culture - could be 2ary to atelectasis. Will continue to monitor  Anemia - May be related to recent blood loss from operation, concern is that lab values initially show a hgb level of 11.6. This value may have been more elevated secondary to mild dehydration. - Agree with transfusing 2 units of packed red blood cells - Will obtain urinalysis to assess for hematuria and guiaic stools    Unspecified essential hypertension - soft blood pressures as such will d/c B blocker    Anxiety state, unspecified - stable    Seizures - stable, continue patient's home regimen dilantin  Hypokalemia - resolved.  Hyponatremia - Will place on normal saline and reassess next  - reassess BMP next am.  Code Status: fulll Family Communication: no family at bedside. Disposition Plan: Pending further workup   Consultants:  Ortho  Procedures:  pending  Antibiotics:  Rocephin  HPI/Subjective: Patient has no new complaints this morning.  Objective: Filed Vitals:   12/01/13 0529  BP: 93/51  Pulse: 69  Temp: 98.8 F (37.1 C)  Resp: 17    Intake/Output Summary (Last 24 hours) at 12/01/13 1427 Last data filed at 12/01/13 1400  Gross per 24 hour  Intake    720 ml  Output   1250 ml  Net   -530 ml   Filed Weights   11/27/13 0135 11/27/13 0225  Weight: 65.772 kg (145 lb) 65.772 kg (145 lb)    Exam:   General:  Pt in NAD, alert and awake  Cardiovascular: rrr, no mrg  Respiratory: cta bl, no wheezes  Abdomen: soft, NT, ND  Musculoskeletal: no clubbing   Data  Reviewed: Basic Metabolic Panel:  Recent Labs Lab 11/27/13 0200 11/28/13 0445 11/29/13 0427 11/30/13 0630 12/01/13 0455  NA 130* 127* 130* 127* 131*  K 3.6* 3.4* 3.7 3.6* 3.9  CL 94* 90* 95* 93* 98  CO2 25 25 25 23 24   GLUCOSE 132* 89 126* 127* 114*  BUN 12 9 13 16 13   CREATININE 0.55 0.39* 0.44* 0.48* 0.46*  CALCIUM 8.2* 8.3* 7.7* 7.4* 7.5*  MG 1.7  --   --   --   --    Liver Function Tests: No results found for this basename: AST, ALT, ALKPHOS, BILITOT, PROT, ALBUMIN,  in the last 168 hours No results found for this basename: LIPASE, AMYLASE,  in the last 168 hours No results found for this basename: AMMONIA,  in the last 168 hours CBC:  Recent Labs Lab 11/27/13 0200 11/28/13 0445 11/29/13 0427 11/30/13 0420 12/01/13 0455  WBC 6.8 5.7 6.2 8.1 8.1  NEUTROABS 5.1  --   --   --   --   HGB 11.6* 11.2* 9.2* 7.5* 5.9*  HCT 35.4* 32.8* 27.9* 20.8* 17.3*  MCV 93.7 90.4 91.8 91.2 91.5  PLT 149* 144* 122* 119* 117*   Cardiac Enzymes: No results found for this basename: CKTOTAL, CKMB, CKMBINDEX, TROPONINI,  in the last 168 hours BNP (last 3 results) No results found for this basename: PROBNP,  in the last 8760 hours CBG: No results found for this basename: GLUCAP,  in the last 168 hours  Recent Results (from the past 240 hour(s))  SURGICAL PCR SCREEN     Status: None   Collection Time    11/27/13 10:52 AM      Result Value Ref Range Status   MRSA, PCR NEGATIVE  NEGATIVE Final   Staphylococcus aureus NEGATIVE  NEGATIVE Final   Comment:            The Xpert SA Assay (FDA     approved for NASAL specimens     in patients over 7 years of age),     is one component of     a comprehensive surveillance     program.  Test performance has     been validated by [employer] for patients greater     than or equal to 23 year old.     It is not intended     to diagnose infection nor to     guide or monitor treatment.     Studies: Dg Chest Port 1 View  12/01/2013    CLINICAL DATA:  Fever  EXAM: PORTABLE CHEST - 1 VIEW  COMPARISON:  December 07, 2006  FINDINGS: There is cardiomegaly with pulmonary vascularity within normal limits. There is atherosclerotic change in aorta.  The interstitium is diffusely prominent but stable. There is no consolidation or effusion. No adenopathy.  There is advanced arthropathy in both shoulders. 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  IMPRESSION: Suspect a degree of chronic congestive heart failure. No consolidation. Erosion of most of the left humeral head. 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   Electronically Signed   By: Bretta Bang M.D.   On: 12/01/2013 13:48    Scheduled Meds: . cefTRIAXone (ROCEPHIN)  IV  1 g Intravenous Q24H  . docusate sodium  100 mg Oral BID  . enoxaparin (LOVENOX) injection  40 mg Subcutaneous Q24H  . ferrous sulfate  325 mg Oral TID PC  . pantoprazole  40 mg Oral Daily  . phenytoin  100 mg Oral TID  . potassium chloride  40 mEq Oral Once   Continuous Infusions: . sodium chloride 1,000 mL (12/01/13 0851)    Time spent: > 35 minutes    Penny Pia  [facility] Pager [phone]. If 7PM-7AM, please contact night-coverage at [email or web address], password [facility] 12/01/2013, 2:27 PM  LOS: 4 days

## 2013-12-01 NOTE — Progress Notes (Signed)
Notified NP Lynch about pt elevated temp 101.4. Tylenol given. Encouraged incentive use. Will continue to monitor.

## 2013-12-02 ENCOUNTER — Inpatient Hospital Stay
Admission: RE | Admit: 2013-12-02 | Discharge: 2013-12-11 | Disposition: A | Discharge status: Home / Self Care | Payer: Medicare Other | Source: Ambulatory Visit | Attending: Internal Medicine | Admitting: Internal Medicine

## 2013-12-02 DIAGNOSIS — S72009A Fracture of unspecified part of neck of unspecified femur, initial encounter for closed fracture: Secondary | ICD-10-CM | POA: Diagnosis not present

## 2013-12-02 DIAGNOSIS — S72009D Fracture of unspecified part of neck of unspecified femur, subsequent encounter for closed fracture with routine healing: Secondary | ICD-10-CM | POA: Diagnosis not present

## 2013-12-02 DIAGNOSIS — M6281 Muscle weakness (generalized): Secondary | ICD-10-CM | POA: Diagnosis not present

## 2013-12-02 DIAGNOSIS — Z96649 Presence of unspecified artificial hip joint: Secondary | ICD-10-CM | POA: Diagnosis not present

## 2013-12-02 DIAGNOSIS — R569 Unspecified convulsions: Secondary | ICD-10-CM | POA: Diagnosis not present

## 2013-12-02 DIAGNOSIS — J189 Pneumonia, unspecified organism: Principal | ICD-10-CM

## 2013-12-02 DIAGNOSIS — R41841 Cognitive communication deficit: Secondary | ICD-10-CM | POA: Diagnosis not present

## 2013-12-02 DIAGNOSIS — R489 Unspecified symbolic dysfunctions: Secondary | ICD-10-CM | POA: Diagnosis not present

## 2013-12-02 DIAGNOSIS — S139XXA Sprain of joints and ligaments of unspecified parts of neck, initial encounter: Secondary | ICD-10-CM | POA: Diagnosis not present

## 2013-12-02 DIAGNOSIS — R262 Difficulty in walking, not elsewhere classified: Secondary | ICD-10-CM | POA: Diagnosis not present

## 2013-12-02 DIAGNOSIS — R059 Cough, unspecified: Secondary | ICD-10-CM | POA: Diagnosis not present

## 2013-12-02 DIAGNOSIS — R279 Unspecified lack of coordination: Secondary | ICD-10-CM | POA: Diagnosis not present

## 2013-12-02 DIAGNOSIS — I10 Essential (primary) hypertension: Secondary | ICD-10-CM | POA: Diagnosis not present

## 2013-12-02 DIAGNOSIS — R5081 Fever presenting with conditions classified elsewhere: Secondary | ICD-10-CM | POA: Diagnosis not present

## 2013-12-02 DIAGNOSIS — F411 Generalized anxiety disorder: Secondary | ICD-10-CM | POA: Diagnosis not present

## 2013-12-02 DIAGNOSIS — M25559 Pain in unspecified hip: Secondary | ICD-10-CM | POA: Diagnosis not present

## 2013-12-02 DIAGNOSIS — D62 Acute posthemorrhagic anemia: Secondary | ICD-10-CM | POA: Diagnosis not present

## 2013-12-02 DIAGNOSIS — Z9181 History of falling: Secondary | ICD-10-CM | POA: Diagnosis not present

## 2013-12-02 DIAGNOSIS — R05 Cough: Secondary | ICD-10-CM | POA: Diagnosis not present

## 2013-12-02 DIAGNOSIS — D649 Anemia, unspecified: Secondary | ICD-10-CM | POA: Diagnosis not present

## 2013-12-02 DIAGNOSIS — K219 Gastro-esophageal reflux disease without esophagitis: Secondary | ICD-10-CM | POA: Diagnosis not present

## 2013-12-02 LAB — TYPE AND SCREEN
ABO/RH(D): O POS
ANTIBODY SCREEN: NEGATIVE
UNIT DIVISION: 0
Unit division: 0

## 2013-12-02 LAB — CBC
HEMATOCRIT: 25 % — AB (ref 36.0–46.0)
Hemoglobin: 8.7 g/dL — ABNORMAL LOW (ref 12.0–15.0)
MCH: 31.1 pg (ref 26.0–34.0)
MCHC: 34.8 g/dL (ref 30.0–36.0)
MCV: 89.3 fL (ref 78.0–100.0)
Platelets: 182 10*3/uL (ref 150–400)
RBC: 2.8 MIL/uL — ABNORMAL LOW (ref 3.87–5.11)
RDW: 13.2 % (ref 11.5–15.5)
WBC: 8 10*3/uL (ref 4.0–10.5)

## 2013-12-02 LAB — BASIC METABOLIC PANEL
ANION GAP: 11 (ref 5–15)
BUN: 10 mg/dL (ref 6–23)
CALCIUM: 7.9 mg/dL — AB (ref 8.4–10.5)
CO2: 23 mEq/L (ref 19–32)
Chloride: 98 mEq/L (ref 96–112)
Creatinine, Ser: 0.44 mg/dL — ABNORMAL LOW (ref 0.50–1.10)
GFR calc Af Amer: >90 mL/min (ref >90)
GFR calc non Af Amer: 88 mL/min — ABNORMAL LOW (ref >90)
GLUCOSE: 106 mg/dL — AB (ref 70–99)
Potassium: 4 mEq/L (ref 3.7–5.3)
Sodium: 132 mEq/L — ABNORMAL LOW (ref 137–147)

## 2013-12-02 LAB — URINE CULTURE
COLONY COUNT: NO GROWTH
Culture: NO GROWTH

## 2013-12-02 NOTE — Progress Notes (Signed)
Per MD, Pt ready for d/c.  Notified RN, Pt, family (daughter, Jerrye BeaversHazel, via phone) and facility.  Per Lynden Angathy, Environmental education officerN Supervisor at [facility], facility ready to receive Pt.  Arranged for transportation.  Providence CrosbyAmanda Candyce Gambino, LCSWA Clinical Social Work [phone]

## 2013-12-02 NOTE — Progress Notes (Signed)
   Subjective: 4 Days Post-Op Procedure(s) (LRB): REMOVAL HARDWARE RIGHT HIP/ RIGHT ARTHROPLASTY BIPOLAR HIP (Right)  Pt c/o mild to moderate pain at times Denies any numbness or tingling distally Disoriented to time today but otherwise alert and awake Patient reports pain as mild.  Objective:   VITALS:   Filed Vitals:   12/02/13 0507  BP: 121/70  Pulse: 77  Temp: 99.1 F (37.3 C)  Resp: 18    Right hip incision healing well nv intact distally No rashes or edema  LABS  Recent Labs  11/30/13 0420 12/01/13 0455 12/01/13 2220 12/02/13 0500  HGB 7.5* 5.9* 8.1* 8.7*  HCT 20.8* 17.3* 23.3* 25.0*  WBC 8.1 8.1  --  8.0  PLT 119* 117*  --  182     Recent Labs  11/30/13 0630 12/01/13 0455 12/02/13 0500  NA 127* 131* 132*  K 3.6* 3.9 4.0  BUN 16 13 10   CREATININE 0.48* 0.46* 0.44*  GLUCOSE 127* 114* 106*     Assessment/Plan: 4 Days Post-Op Procedure(s) (LRB): REMOVAL HARDWARE RIGHT HIP/ RIGHT ARTHROPLASTY BIPOLAR HIP (Right) Currently stable Blood loss anemia improved     Brad Gennie Eisinger, MPAS, PA-C  12/02/2013, 7:05 AM

## 2013-12-02 NOTE — Progress Notes (Signed)
Paged Ortho PA Alphonsa OverallBrad Dixon per SW request.  Ortho states ok for patient to d/c to [facility] today and advises [facility] will complete d/c orders.  Text page to Dr. Cena BentonVega sent for further d/c instructions.  Dorothyann PengKim Kandon Hosking RN

## 2013-12-02 NOTE — Progress Notes (Signed)
Message from [place], Environmental education officerN Supervisor at [facility] [employer], stating that the fax # that CSW originally faxed Pt's hard scripts to is broken and she asked that CSW fax scripts to another fax number: [phone].  CSW attempted to fax the scripts to this number 3 times and each time CSW was informed that the line was busy.  CSW made several attempts to notify Lynden AngCathy (or anyone[facility] at [facility].  The phone rang endlessly at the [place] RN's station each time; the extension for the [place] RN's station doesn't work.  Providence CrosbyAmanda Beniah Magnan, LCSW Clinical Social Work [phone]

## 2013-12-02 NOTE — Discharge Summary (Signed)
Physician Discharge Summary  Melissa Bradley ZOX:[id number] DOB: March 01, 1925 DOA: 11/27/2013  PCP: No primary provider on file.  Admit date: 11/27/2013 Discharge date: 12/02/2013  Time spent: > 35 minutes  Recommendations for Outpatient Follow-up:  1. Patient to f/u with orthopaedic surgeons post discharge. Please call their office for specific date and time.  Discharge Diagnoses:  Please see list below  Discharge Condition: stable  Diet recommendation: regular diet  Filed Weights   11/27/13 0135 11/27/13 0225  Weight: 65.772 kg (145 lb) 65.772 kg (145 lb)    History of present illness:  Pt is an 80 y/o with h/o Htn, anxiety, seizure d/o, and GERD who presented after a fall with subsequent right hip pain and was found to have a femoral neck fx.  Hospital Course:  Active Problems:  Fracture of femoral neck, right, closed  - Ortho onboard while pt in house - SNF to continue PT while there  Fever  - will obtain chest x ray negative for infection, urinalysis: was clear with some leukocytes but no nitrites and no complaints of dysuria, urine culture, blood culture  - could be 2ary to atelectasis. Will continue to monitor  - resolved fever. Given no source of infection identified on day of d/c will discharge off antibiotics.  Anemia  - May be related to recent blood loss from operation, concern is that lab values initially show a hgb level of 11.6. This value may have been more elevated secondary to mild dehydration.  - Agree with transfusing 2 units of packed red blood cells  - U/A showing no blood in urine.  Unspecified essential hypertension  - soft blood pressures as such will d/c B blocker   Anxiety state, unspecified  - stable   Seizures  - stable, continue patient's home regimen dilantin   Hypokalemia  - resolved.   Hyponatremia  - improved after IVF administration and improved oral intake - likely due to poor oral solute intake.  Procedures:  Please refer to  ortho's procedure note  Consultations:  Ortho: Dr. Shelle Iron  Discharge Exam: Filed Vitals:   12/02/13 0507  BP: 121/70  Pulse: 77  Temp: 99.1 F (37.3 C)  Resp: 18    General: pt in nad, alert and awake Cardiovascular: rrr, no mrg Respiratory: cta bl, no wheezes  Discharge Instructions You were cared for by a hospitalist during your hospital stay. If you have any questions about your discharge medications or the care you received while you were in the hospital after you are discharged, you can call the unit and asked to speak with the hospitalist on call if the hospitalist that took care of you is not available. Once you are discharged, your primary care physician will handle any further medical issues. Please note that NO REFILLS for any discharge medications will be authorized once you are discharged, as it is imperative that you return to your primary care physician (or establish a relationship with a primary care physician if you do not have one) for your aftercare needs so that they can reassess your need for medications and monitor your lab values.  Discharge Instructions   Call MD for:  severe uncontrolled pain    Complete by:  As directed      Call MD for:  temperature >100.4    Complete by:  As directed      Diet - low sodium heart healthy    Complete by:  As directed      Increase activity slowly  Complete by:  As directed      Partial weight bearing    Complete by:  As directed   Laterality:  right  Extremity:  Lower            Medication List    STOP taking these medications       metoprolol tartrate 25 MG tablet  Commonly known as:  LOPRESSOR      TAKE these medications       enoxaparin 40 MG/0.4ML injection  Commonly known as:  LOVENOX  Inject 0.4 mLs (40 mg total) into the skin daily.     furosemide 20 MG tablet  Commonly known as:  LASIX  Take 20 mg by mouth daily.     omeprazole 20 MG capsule  Commonly known as:  PRILOSEC  Take 20 mg by  mouth daily.     oxyCODONE-acetaminophen 5-325 MG per tablet  Commonly known as:  PERCOCET/ROXICET  Take 1-2 tablets by mouth every 4 (four) hours as needed for severe pain.     phenytoin 100 MG ER capsule  Commonly known as:  DILANTIN  Take by mouth 3 (three) times daily.     SYSTANE BALANCE OP  Apply 1-2 drops to eye daily as needed (dry eyes.).       No Known Allergies     Follow-up Information   Follow up with Shelda Pal, MD. Schedule an appointment as soon as possible for a visit in 2 weeks.   Specialty:  Orthopedic Surgery   Contact information:   [address] [place] [address] [phone]        The results of significant diagnostics from this hospitalization (including imaging, microbiology, ancillary and laboratory) are listed below for reference.    Significant Diagnostic Studies: Dg Pelvis Portable  11/28/2013   CLINICAL DATA:  Right hip replacement.  EXAM: PORTABLE PELVIS 1-2 VIEWS  COMPARISON:  12/12/2006.  12/06/2006.  FINDINGS: Portable AP view of the lower pelvis at 2025 hrs shows the patient to be status post right hip hemiarthroplasty. There is been extensive ORIF of the mid femur and some of the proximal screws related to the plates present previously have been removed in the interval. No evidence for immediate hardware complications. Gas in the overlying soft tissues is compatible with the recent surgery.  IMPRESSION: Status post right hip hemiarthroplasty. No evidence for immediate hardware complications.   Electronically Signed   By: Kennith Center M.D.   On: 11/28/2013 20:39   Dg Chest Port 1 View  12/01/2013   CLINICAL DATA:  Fever  EXAM: PORTABLE CHEST - 1 VIEW  COMPARISON:  December 07, 2006  FINDINGS: There is cardiomegaly with pulmonary vascularity within normal limits. There is atherosclerotic change in aorta.  The interstitium is diffusely prominent but stable. There is no consolidation or effusion. No adenopathy.  There is  advanced arthropathy in both shoulders. 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  IMPRESSION: Suspect a degree of chronic congestive heart failure. No consolidation. Erosion of most of the left humeral head. 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   Electronically Signed   By: Bretta Bang M.D.   On: 12/01/2013 13:48    Microbiology: Recent Results (from the past 240 hour(s))  SURGICAL PCR SCREEN     Status: None   Collection Time    11/27/13 10:52 AM      Result Value Ref Range Status   MRSA, PCR NEGATIVE  NEGATIVE Final   Staphylococcus aureus NEGATIVE  NEGATIVE Final   Comment:            The Xpert SA Assay (FDA     approved for NASAL specimens     in patients over 78 years of age),     is one component of     a comprehensive surveillance     program.  Test performance has     been validated by [facility] for patients greater     than or equal to 78 year old.     It is not intended     to diagnose infection nor to     guide or monitor treatment.     Labs: Basic Metabolic Panel:  Recent Labs Lab 11/27/13 0200 11/28/13 0445 11/29/13 0427 11/30/13 0630 12/01/13 0455 12/02/13 0500  NA 130* 127* 130* 127* 131* 132*  K 3.6* 3.4* 3.7 3.6* 3.9 4.0  CL 94* 90* 95* 93* 98 98  CO2 25 25 25 23 24 23   GLUCOSE 132* 89 126* 127* 114* 106*  BUN 12 9 13 16 13 10   CREATININE 0.55 0.39* 0.44* 0.48* 0.46* 0.44*  CALCIUM 8.2* 8.3* 7.7* 7.4* 7.5* 7.9*  MG 1.7  --   --   --   --   --    Liver Function Tests: No results found for this basename: AST, ALT, ALKPHOS, BILITOT, PROT, ALBUMIN,  in the last 168 hours No results found for this basename: LIPASE, AMYLASE,  in the last 168 hours No results found for this basename: AMMONIA,  in the last 168 hours CBC:  Recent Labs Lab 11/27/13 0200 11/28/13 0445 11/29/13 0427 11/30/13 0420 12/01/13 0455 12/01/13 2220 12/02/13 0500  WBC 6.8 5.7 6.2 8.1 8.1  --  8.0   NEUTROABS 5.1  --   --   --   --   --   --   HGB 11.6* 11.2* 9.2* 7.5* 5.9* 8.1* 8.7*  HCT 35.4* 32.8* 27.9* 20.8* 17.3* 23.3* 25.0*  MCV 93.7 90.4 91.8 91.2 91.5  --  89.3  PLT 149* 144* 122* 119* 117*  --  182   Cardiac Enzymes: No results found for this basename: CKTOTAL, CKMB, CKMBINDEX, TROPONINI,  in the last 168 hours BNP: BNP (last 3 results) No results found for this basename: PROBNP,  in the last 8760 hours CBG: No results found for this basename: GLUCAP,  in the last 168 hours     Signed:  [name], Jeniya Flannigan  [facility] 12/02/2013, 10:12 AM

## 2013-12-03 ENCOUNTER — Non-Acute Institutional Stay (SKILLED_NURSING_FACILITY): Payer: Medicare Other | Admitting: Internal Medicine

## 2013-12-03 ENCOUNTER — Ambulatory Visit (HOSPITAL_COMMUNITY): Payer: No Typology Code available for payment source | Attending: Internal Medicine

## 2013-12-03 DIAGNOSIS — S72009D Fracture of unspecified part of neck of unspecified femur, subsequent encounter for closed fracture with routine healing: Secondary | ICD-10-CM | POA: Diagnosis not present

## 2013-12-03 DIAGNOSIS — I10 Essential (primary) hypertension: Secondary | ICD-10-CM | POA: Insufficient documentation

## 2013-12-03 DIAGNOSIS — D62 Acute posthemorrhagic anemia: Secondary | ICD-10-CM

## 2013-12-03 DIAGNOSIS — R059 Cough, unspecified: Secondary | ICD-10-CM | POA: Diagnosis not present

## 2013-12-03 DIAGNOSIS — R05 Cough: Secondary | ICD-10-CM | POA: Insufficient documentation

## 2013-12-03 DIAGNOSIS — R5081 Fever presenting with conditions classified elsewhere: Secondary | ICD-10-CM

## 2013-12-03 DIAGNOSIS — S72141D Displaced intertrochanteric fracture of right femur, subsequent encounter for closed fracture with routine healing: Secondary | ICD-10-CM

## 2013-12-03 NOTE — Progress Notes (Signed)
Patient ID: Melissa Bradley, female   DOB: 10-18-1924, 80 y.o.   MRN: [id number]  Facility; [facility] Chief complaint; admission to SNF [facility]/21 to 7/26  History; this is an 80 year old woman who apparently lives on her own in [place]. She was admitted to hospital after having fallen suffering a right hip fracture and was found to have a femoral neck fracture. She underwent operative repair(arthroplasty). Postoperatively she developed anemia. She was apparently transfused the. She also developed postoperative fever urine and blood cultures were done. She was reasonably asymptomatic. She was discharged off antibiotics. She is on Lovenox for DVT prophylaxis.  Past Medical History  Diagnosis Date  . Hypertension   . Anxiety   . Seizures   . GERD (gastroesophageal reflux disease)    Past Surgical History  Procedure Laterality Date  . Joint replacement    . Fracture surgery    . Hip arthroplasty Right 11/28/2013    Procedure: REMOVAL HARDWARE RIGHT HIP/ RIGHT ARTHROPLASTY BIPOLAR HIP;  Surgeon: Shelda PalMatthew D Olin, MD;  Location: [facility];  Service: Orthopedics;  Laterality: Right;    Current Outpatient Prescriptions on File Prior to Visit  Medication Sig Dispense Refill  . enoxaparin (LOVENOX) 40 MG/0.4ML injection Inject 0.4 mLs (40 mg total) into the skin daily.  12 Syringe  0  . furosemide (LASIX) 20 MG tablet Take 20 mg by mouth daily.      Marland Kitchen. omeprazole (PRILOSEC) 20 MG capsule Take 20 mg by mouth daily.      Marland Kitchen. oxyCODONE-acetaminophen (PERCOCET/ROXICET) 5-325 MG per tablet Take 1-2 tablets by mouth every 4 (four) hours as needed for severe pain.  80 tablet  0  . phenytoin (DILANTIN) 100 MG ER capsule Take by mouth 3 (three) times daily.      Marland Kitchen. Propylene Glycol (SYSTANE BALANCE OP) Apply 1-2 drops to eye daily as needed (dry eyes.).       Social; from what I am able to gather the patient lives in [place] on her own. She tells me she walked with a walker. He does not  give a history of falling. However her exact functional status is unclear.  reports that she has never smoked. She does not have any smokeless tobacco history on file. She reports that she does not drink alcohol or use illicit drugs.  family history includes Cancer in an other family member; Coronary artery disease in an other family member; Seizures in an other family member.  Review of systems Gen. very difficult to do to a generally your double demeanor, pressure of speech. She apparently had a temperature of 100 last night Respiratory; she does not describe coughing GU no dysuria Neurologic/mental status very anxious per the nursing staff  Physical exam Gen. patient is not in a medical distress however she is anxious, marked pressure of speech. Very difficult to calm down Vitals temperature 90.8-pulse 69-respirations 20-blood pressure 117/63-O2 sat 99% on room air Respiratory; a few inspiratory crackles in the left lower lobe Cardiac heart sounds are normal no murmur she appears to be euvolemic Abdomen; no liver no spleen no tenderness GU no suprapubic or costovertebral angle tenderness Musculoskeletal; her right hip incision looks clean staples still in no drainage or evidence of infection. Probable bilateral knee replacements Extremities she has venous stasis, mild edema but no evidence of a DVT  Impression/plan #1 right hip fracture. She is status post a bipolar right hip arthroplasty. I believe there might have been some hardware in the right hip  before this. Lovenox is appropriate here, I would be concerned about a more prolonged duration of anticoagulation #2 postoperative fever; I reviewed her urine and blood cultures they were both negative. Chest x-ray is listed below. She has left lower lobe crackles I'll repeat her chest x-ray #3 listed as having generalized anxiety; according to her daughter this is a permanent feature. I therefore don't think she has postoperative delirium,  in fact when you can get her to stop talking her cognition seems to be fairly good, she did know her daughter's phone number. I would wonder about an under/undiagnosed psychiatric problem if this continues #4 DVT prophylaxis; as noted I think Lovenox is appropriate in this case #5 long-standing seizure disorder which the patient states is for 40 years she is on Dilantin. In fact this is one of the issues she is most upset about I think we have given her a different type of pill [different color] and she is very upset about this insisting the nurse has not given her her Dilantin. Through her rapid forceful speech she tells me that her last seizure was a year ago. A Dilantin level would be appropriate here which I will order on Wednesday #6 on a PPI once again the reason here is not completely clear.

## 2013-12-05 ENCOUNTER — Other Ambulatory Visit: Payer: Self-pay | Admitting: *Deleted

## 2013-12-05 MED ORDER — OXYCODONE-ACETAMINOPHEN 5-325 MG PO TABS: ORAL_TABLET | ORAL | Status: AC

## 2013-12-05 NOTE — Telephone Encounter (Signed)
[facility] 

## 2013-12-08 LAB — CULTURE, BLOOD (ROUTINE X 2)
CULTURE: NO GROWTH
Culture: NO GROWTH

## 2013-12-11 DIAGNOSIS — K2100 Gastro-esophageal reflux disease with esophagitis, without bleeding: Secondary | ICD-10-CM | POA: Diagnosis not present

## 2013-12-11 DIAGNOSIS — S72009D Fracture of unspecified part of neck of unspecified femur, subsequent encounter for closed fracture with routine healing: Secondary | ICD-10-CM | POA: Diagnosis not present

## 2013-12-11 DIAGNOSIS — E119 Type 2 diabetes mellitus without complications: Secondary | ICD-10-CM | POA: Diagnosis not present

## 2013-12-11 DIAGNOSIS — Z4789 Encounter for other orthopedic aftercare: Secondary | ICD-10-CM | POA: Diagnosis not present

## 2013-12-11 DIAGNOSIS — R609 Edema, unspecified: Secondary | ICD-10-CM | POA: Diagnosis not present

## 2013-12-11 DIAGNOSIS — Z96649 Presence of unspecified artificial hip joint: Secondary | ICD-10-CM | POA: Diagnosis not present

## 2013-12-11 DIAGNOSIS — M25559 Pain in unspecified hip: Secondary | ICD-10-CM | POA: Diagnosis not present

## 2013-12-11 DIAGNOSIS — G40909 Epilepsy, unspecified, not intractable, without status epilepticus: Secondary | ICD-10-CM | POA: Diagnosis not present

## 2013-12-11 DIAGNOSIS — K039 Disease of hard tissues of teeth, unspecified: Secondary | ICD-10-CM | POA: Diagnosis not present

## 2013-12-11 DIAGNOSIS — Z9660 Presence of unspecified orthopedic joint implant: Secondary | ICD-10-CM | POA: Diagnosis not present

## 2013-12-13 DIAGNOSIS — G40909 Epilepsy, unspecified, not intractable, without status epilepticus: Secondary | ICD-10-CM | POA: Diagnosis not present

## 2013-12-13 DIAGNOSIS — K2100 Gastro-esophageal reflux disease with esophagitis, without bleeding: Secondary | ICD-10-CM | POA: Diagnosis not present

## 2013-12-13 DIAGNOSIS — M25559 Pain in unspecified hip: Secondary | ICD-10-CM | POA: Diagnosis not present

## 2013-12-13 DIAGNOSIS — R609 Edema, unspecified: Secondary | ICD-10-CM | POA: Diagnosis not present

## 2014-01-10 DIAGNOSIS — Z96649 Presence of unspecified artificial hip joint: Secondary | ICD-10-CM | POA: Diagnosis not present

## 2014-02-15 DIAGNOSIS — Z23 Encounter for immunization: Secondary | ICD-10-CM | POA: Diagnosis not present

## 2014-02-22 DIAGNOSIS — Z96641 Presence of right artificial hip joint: Secondary | ICD-10-CM | POA: Diagnosis not present

## 2014-02-22 DIAGNOSIS — Z471 Aftercare following joint replacement surgery: Secondary | ICD-10-CM | POA: Diagnosis not present

## 2014-02-22 DIAGNOSIS — S72001D Fracture of unspecified part of neck of right femur, subsequent encounter for closed fracture with routine healing: Secondary | ICD-10-CM | POA: Diagnosis not present

## 2014-03-11 DIAGNOSIS — Z23 Encounter for immunization: Secondary | ICD-10-CM | POA: Diagnosis not present

## 2014-03-11 DIAGNOSIS — I10 Essential (primary) hypertension: Secondary | ICD-10-CM | POA: Diagnosis not present

## 2014-03-11 DIAGNOSIS — R262 Difficulty in walking, not elsewhere classified: Secondary | ICD-10-CM | POA: Diagnosis not present

## 2014-03-11 DIAGNOSIS — Z96649 Presence of unspecified artificial hip joint: Secondary | ICD-10-CM | POA: Diagnosis not present

## 2014-03-11 DIAGNOSIS — D649 Anemia, unspecified: Secondary | ICD-10-CM | POA: Diagnosis not present

## 2014-03-11 DIAGNOSIS — I82409 Acute embolism and thrombosis of unspecified deep veins of unspecified lower extremity: Secondary | ICD-10-CM | POA: Diagnosis not present

## 2014-03-11 DIAGNOSIS — Z471 Aftercare following joint replacement surgery: Secondary | ICD-10-CM | POA: Diagnosis not present

## 2014-03-11 DIAGNOSIS — R54 Age-related physical debility: Secondary | ICD-10-CM | POA: Diagnosis not present

## 2014-03-11 DIAGNOSIS — M7989 Other specified soft tissue disorders: Secondary | ICD-10-CM | POA: Diagnosis not present

## 2014-03-11 DIAGNOSIS — I82402 Acute embolism and thrombosis of unspecified deep veins of left lower extremity: Secondary | ICD-10-CM | POA: Diagnosis not present

## 2014-03-11 DIAGNOSIS — R609 Edema, unspecified: Secondary | ICD-10-CM | POA: Diagnosis not present

## 2014-03-11 DIAGNOSIS — K219 Gastro-esophageal reflux disease without esophagitis: Secondary | ICD-10-CM | POA: Diagnosis not present

## 2014-03-11 DIAGNOSIS — G4089 Other seizures: Secondary | ICD-10-CM | POA: Diagnosis not present

## 2014-03-11 DIAGNOSIS — I824Z2 Acute embolism and thrombosis of unspecified deep veins of left distal lower extremity: Secondary | ICD-10-CM | POA: Diagnosis not present

## 2014-03-11 DIAGNOSIS — G40909 Epilepsy, unspecified, not intractable, without status epilepticus: Secondary | ICD-10-CM | POA: Diagnosis not present

## 2014-03-11 DIAGNOSIS — I82412 Acute embolism and thrombosis of left femoral vein: Secondary | ICD-10-CM | POA: Diagnosis not present

## 2014-03-11 DIAGNOSIS — M6281 Muscle weakness (generalized): Secondary | ICD-10-CM | POA: Diagnosis not present

## 2014-03-11 DIAGNOSIS — R404 Transient alteration of awareness: Secondary | ICD-10-CM | POA: Diagnosis not present

## 2014-03-11 DIAGNOSIS — R531 Weakness: Secondary | ICD-10-CM | POA: Diagnosis not present

## 2014-03-11 DIAGNOSIS — I82492 Acute embolism and thrombosis of other specified deep vein of left lower extremity: Secondary | ICD-10-CM | POA: Diagnosis not present

## 2014-03-11 DIAGNOSIS — I82432 Acute embolism and thrombosis of left popliteal vein: Secondary | ICD-10-CM | POA: Diagnosis present

## 2014-03-14 DIAGNOSIS — Z471 Aftercare following joint replacement surgery: Secondary | ICD-10-CM | POA: Diagnosis not present

## 2014-03-14 DIAGNOSIS — Z96649 Presence of unspecified artificial hip joint: Secondary | ICD-10-CM | POA: Diagnosis not present

## 2014-03-14 DIAGNOSIS — R262 Difficulty in walking, not elsewhere classified: Secondary | ICD-10-CM | POA: Diagnosis not present

## 2014-03-14 DIAGNOSIS — I10 Essential (primary) hypertension: Secondary | ICD-10-CM | POA: Diagnosis not present

## 2014-03-14 DIAGNOSIS — I82492 Acute embolism and thrombosis of other specified deep vein of left lower extremity: Secondary | ICD-10-CM | POA: Diagnosis not present

## 2014-03-14 DIAGNOSIS — I82409 Acute embolism and thrombosis of unspecified deep veins of unspecified lower extremity: Secondary | ICD-10-CM | POA: Diagnosis not present

## 2014-03-14 DIAGNOSIS — M6281 Muscle weakness (generalized): Secondary | ICD-10-CM | POA: Diagnosis not present

## 2014-03-14 DIAGNOSIS — G40909 Epilepsy, unspecified, not intractable, without status epilepticus: Secondary | ICD-10-CM | POA: Diagnosis not present

## 2014-03-14 DIAGNOSIS — R54 Age-related physical debility: Secondary | ICD-10-CM | POA: Diagnosis not present

## 2014-03-14 DIAGNOSIS — Z23 Encounter for immunization: Secondary | ICD-10-CM | POA: Diagnosis not present

## 2014-03-14 DIAGNOSIS — D649 Anemia, unspecified: Secondary | ICD-10-CM | POA: Diagnosis not present

## 2014-03-14 DIAGNOSIS — I82402 Acute embolism and thrombosis of unspecified deep veins of left lower extremity: Secondary | ICD-10-CM | POA: Diagnosis not present

## 2014-03-14 DIAGNOSIS — G4089 Other seizures: Secondary | ICD-10-CM | POA: Diagnosis not present

## 2014-03-14 DIAGNOSIS — K219 Gastro-esophageal reflux disease without esophagitis: Secondary | ICD-10-CM | POA: Diagnosis not present

## 2014-03-18 DIAGNOSIS — G4089 Other seizures: Secondary | ICD-10-CM | POA: Diagnosis not present

## 2014-03-18 DIAGNOSIS — I82402 Acute embolism and thrombosis of unspecified deep veins of left lower extremity: Secondary | ICD-10-CM | POA: Diagnosis not present

## 2014-03-18 DIAGNOSIS — K219 Gastro-esophageal reflux disease without esophagitis: Secondary | ICD-10-CM | POA: Diagnosis not present

## 2014-03-18 DIAGNOSIS — I10 Essential (primary) hypertension: Secondary | ICD-10-CM | POA: Diagnosis not present

## 2014-04-06 DIAGNOSIS — Y929 Unspecified place or not applicable: Secondary | ICD-10-CM | POA: Diagnosis not present

## 2014-04-06 DIAGNOSIS — W19XXXA Unspecified fall, initial encounter: Secondary | ICD-10-CM | POA: Diagnosis not present

## 2014-04-06 DIAGNOSIS — R569 Unspecified convulsions: Secondary | ICD-10-CM | POA: Diagnosis not present

## 2014-04-06 DIAGNOSIS — M25552 Pain in left hip: Secondary | ICD-10-CM | POA: Diagnosis not present

## 2014-04-06 DIAGNOSIS — I10 Essential (primary) hypertension: Secondary | ICD-10-CM | POA: Diagnosis not present

## 2014-04-07 DIAGNOSIS — I10 Essential (primary) hypertension: Secondary | ICD-10-CM | POA: Diagnosis not present

## 2014-04-07 DIAGNOSIS — R569 Unspecified convulsions: Secondary | ICD-10-CM | POA: Diagnosis not present

## 2014-04-07 DIAGNOSIS — Y929 Unspecified place or not applicable: Secondary | ICD-10-CM | POA: Diagnosis not present

## 2014-04-07 DIAGNOSIS — R531 Weakness: Secondary | ICD-10-CM | POA: Diagnosis not present

## 2014-04-07 DIAGNOSIS — W19XXXA Unspecified fall, initial encounter: Secondary | ICD-10-CM | POA: Diagnosis not present

## 2014-04-07 DIAGNOSIS — M25552 Pain in left hip: Secondary | ICD-10-CM | POA: Diagnosis not present

## 2014-04-23 DIAGNOSIS — Z79899 Other long term (current) drug therapy: Secondary | ICD-10-CM | POA: Diagnosis not present

## 2014-04-23 DIAGNOSIS — M549 Dorsalgia, unspecified: Secondary | ICD-10-CM | POA: Diagnosis not present

## 2014-05-23 ENCOUNTER — Encounter (HOSPITAL_COMMUNITY): Payer: Self-pay | Admitting: Orthopedic Surgery

## 2014-07-04 DIAGNOSIS — M549 Dorsalgia, unspecified: Secondary | ICD-10-CM | POA: Diagnosis not present

## 2014-07-04 DIAGNOSIS — Z0000 Encounter for general adult medical examination without abnormal findings: Secondary | ICD-10-CM | POA: Diagnosis not present

## 2014-07-04 DIAGNOSIS — I10 Essential (primary) hypertension: Secondary | ICD-10-CM | POA: Diagnosis not present

## 2014-08-09 DIAGNOSIS — Z0000 Encounter for general adult medical examination without abnormal findings: Secondary | ICD-10-CM | POA: Diagnosis not present

## 2014-08-22 DIAGNOSIS — M1710 Unilateral primary osteoarthritis, unspecified knee: Secondary | ICD-10-CM | POA: Diagnosis not present

## 2014-08-22 DIAGNOSIS — F411 Generalized anxiety disorder: Secondary | ICD-10-CM | POA: Diagnosis not present

## 2014-08-22 DIAGNOSIS — M1611 Unilateral primary osteoarthritis, right hip: Secondary | ICD-10-CM | POA: Diagnosis not present

## 2014-08-22 DIAGNOSIS — M2550 Pain in unspecified joint: Secondary | ICD-10-CM | POA: Diagnosis not present

## 2014-11-05 DIAGNOSIS — I10 Essential (primary) hypertension: Secondary | ICD-10-CM | POA: Diagnosis not present

## 2014-11-05 DIAGNOSIS — Z79899 Other long term (current) drug therapy: Secondary | ICD-10-CM | POA: Diagnosis not present

## 2014-12-02 DIAGNOSIS — I2510 Atherosclerotic heart disease of native coronary artery without angina pectoris: Secondary | ICD-10-CM | POA: Diagnosis not present

## 2014-12-02 DIAGNOSIS — G40909 Epilepsy, unspecified, not intractable, without status epilepticus: Secondary | ICD-10-CM | POA: Diagnosis not present

## 2014-12-02 DIAGNOSIS — G8929 Other chronic pain: Secondary | ICD-10-CM | POA: Diagnosis not present

## 2014-12-02 DIAGNOSIS — M25562 Pain in left knee: Secondary | ICD-10-CM | POA: Diagnosis not present

## 2014-12-02 DIAGNOSIS — M25561 Pain in right knee: Secondary | ICD-10-CM | POA: Diagnosis not present

## 2014-12-02 DIAGNOSIS — R531 Weakness: Secondary | ICD-10-CM | POA: Diagnosis not present

## 2014-12-02 DIAGNOSIS — K219 Gastro-esophageal reflux disease without esophagitis: Secondary | ICD-10-CM | POA: Diagnosis not present

## 2014-12-09 DIAGNOSIS — M898X6 Other specified disorders of bone, lower leg: Secondary | ICD-10-CM | POA: Diagnosis not present

## 2014-12-09 DIAGNOSIS — T447X5A Adverse effect of beta-adrenoreceptor antagonists, initial encounter: Secondary | ICD-10-CM | POA: Diagnosis not present

## 2014-12-09 DIAGNOSIS — M25552 Pain in left hip: Secondary | ICD-10-CM | POA: Diagnosis not present

## 2014-12-09 DIAGNOSIS — S8992XA Unspecified injury of left lower leg, initial encounter: Secondary | ICD-10-CM | POA: Diagnosis not present

## 2014-12-09 DIAGNOSIS — Z79899 Other long term (current) drug therapy: Secondary | ICD-10-CM | POA: Diagnosis not present

## 2014-12-09 DIAGNOSIS — Z96652 Presence of left artificial knee joint: Secondary | ICD-10-CM | POA: Diagnosis not present

## 2014-12-09 DIAGNOSIS — R262 Difficulty in walking, not elsewhere classified: Secondary | ICD-10-CM | POA: Diagnosis not present

## 2014-12-09 DIAGNOSIS — R079 Chest pain, unspecified: Secondary | ICD-10-CM | POA: Diagnosis not present

## 2014-12-09 DIAGNOSIS — R001 Bradycardia, unspecified: Secondary | ICD-10-CM | POA: Diagnosis not present

## 2014-12-09 DIAGNOSIS — M25462 Effusion, left knee: Secondary | ICD-10-CM | POA: Diagnosis not present

## 2014-12-09 DIAGNOSIS — R930 Abnormal findings on diagnostic imaging of skull and head, not elsewhere classified: Secondary | ICD-10-CM | POA: Diagnosis not present

## 2014-12-09 DIAGNOSIS — F0390 Unspecified dementia without behavioral disturbance: Secondary | ICD-10-CM | POA: Diagnosis not present

## 2014-12-09 DIAGNOSIS — R748 Abnormal levels of other serum enzymes: Secondary | ICD-10-CM | POA: Diagnosis not present

## 2014-12-09 DIAGNOSIS — Z791 Long term (current) use of non-steroidal anti-inflammatories (NSAID): Secondary | ICD-10-CM | POA: Diagnosis not present

## 2014-12-09 DIAGNOSIS — Z96653 Presence of artificial knee joint, bilateral: Secondary | ICD-10-CM | POA: Diagnosis not present

## 2014-12-09 DIAGNOSIS — Z96651 Presence of right artificial knee joint: Secondary | ICD-10-CM | POA: Diagnosis not present

## 2014-12-09 DIAGNOSIS — M170 Bilateral primary osteoarthritis of knee: Secondary | ICD-10-CM | POA: Diagnosis not present

## 2014-12-09 DIAGNOSIS — Z7982 Long term (current) use of aspirin: Secondary | ICD-10-CM | POA: Diagnosis not present

## 2014-12-09 DIAGNOSIS — R5381 Other malaise: Secondary | ICD-10-CM | POA: Diagnosis not present

## 2014-12-09 DIAGNOSIS — S79912A Unspecified injury of left hip, initial encounter: Secondary | ICD-10-CM | POA: Diagnosis not present

## 2014-12-10 DIAGNOSIS — M79661 Pain in right lower leg: Secondary | ICD-10-CM | POA: Diagnosis not present

## 2014-12-10 DIAGNOSIS — R791 Abnormal coagulation profile: Secondary | ICD-10-CM | POA: Diagnosis not present

## 2014-12-10 DIAGNOSIS — M79662 Pain in left lower leg: Secondary | ICD-10-CM | POA: Diagnosis not present

## 2014-12-10 DIAGNOSIS — R262 Difficulty in walking, not elsewhere classified: Secondary | ICD-10-CM | POA: Diagnosis not present

## 2014-12-12 DIAGNOSIS — R279 Unspecified lack of coordination: Secondary | ICD-10-CM | POA: Diagnosis not present

## 2014-12-12 DIAGNOSIS — Z7401 Bed confinement status: Secondary | ICD-10-CM | POA: Diagnosis not present

## 2014-12-13 DIAGNOSIS — R531 Weakness: Secondary | ICD-10-CM | POA: Diagnosis not present

## 2014-12-13 DIAGNOSIS — R269 Unspecified abnormalities of gait and mobility: Secondary | ICD-10-CM | POA: Diagnosis not present

## 2014-12-16 DIAGNOSIS — M6281 Muscle weakness (generalized): Secondary | ICD-10-CM | POA: Diagnosis not present

## 2014-12-16 DIAGNOSIS — Z9181 History of falling: Secondary | ICD-10-CM | POA: Diagnosis not present

## 2014-12-16 DIAGNOSIS — R262 Difficulty in walking, not elsewhere classified: Secondary | ICD-10-CM | POA: Diagnosis not present

## 2014-12-16 DIAGNOSIS — R001 Bradycardia, unspecified: Secondary | ICD-10-CM | POA: Diagnosis not present

## 2014-12-17 DIAGNOSIS — R262 Difficulty in walking, not elsewhere classified: Secondary | ICD-10-CM | POA: Diagnosis not present

## 2014-12-17 DIAGNOSIS — M6281 Muscle weakness (generalized): Secondary | ICD-10-CM | POA: Diagnosis not present

## 2014-12-17 DIAGNOSIS — Z9181 History of falling: Secondary | ICD-10-CM | POA: Diagnosis not present

## 2014-12-17 DIAGNOSIS — R001 Bradycardia, unspecified: Secondary | ICD-10-CM | POA: Diagnosis not present

## 2014-12-18 DIAGNOSIS — R001 Bradycardia, unspecified: Secondary | ICD-10-CM | POA: Diagnosis not present

## 2014-12-18 DIAGNOSIS — R262 Difficulty in walking, not elsewhere classified: Secondary | ICD-10-CM | POA: Diagnosis not present

## 2014-12-18 DIAGNOSIS — Z9181 History of falling: Secondary | ICD-10-CM | POA: Diagnosis not present

## 2014-12-18 DIAGNOSIS — M6281 Muscle weakness (generalized): Secondary | ICD-10-CM | POA: Diagnosis not present

## 2014-12-19 DIAGNOSIS — R001 Bradycardia, unspecified: Secondary | ICD-10-CM | POA: Diagnosis not present

## 2014-12-19 DIAGNOSIS — R2243 Localized swelling, mass and lump, lower limb, bilateral: Secondary | ICD-10-CM | POA: Diagnosis not present

## 2014-12-19 DIAGNOSIS — Z9181 History of falling: Secondary | ICD-10-CM | POA: Diagnosis not present

## 2014-12-19 DIAGNOSIS — R262 Difficulty in walking, not elsewhere classified: Secondary | ICD-10-CM | POA: Diagnosis not present

## 2014-12-19 DIAGNOSIS — M6281 Muscle weakness (generalized): Secondary | ICD-10-CM | POA: Diagnosis not present

## 2014-12-20 DIAGNOSIS — F419 Anxiety disorder, unspecified: Secondary | ICD-10-CM | POA: Diagnosis not present

## 2014-12-20 DIAGNOSIS — I10 Essential (primary) hypertension: Secondary | ICD-10-CM | POA: Diagnosis not present

## 2014-12-20 DIAGNOSIS — G4089 Other seizures: Secondary | ICD-10-CM | POA: Diagnosis not present

## 2014-12-20 DIAGNOSIS — Z79899 Other long term (current) drug therapy: Secondary | ICD-10-CM | POA: Diagnosis not present

## 2014-12-21 DIAGNOSIS — F419 Anxiety disorder, unspecified: Secondary | ICD-10-CM | POA: Diagnosis not present

## 2014-12-23 DIAGNOSIS — M6281 Muscle weakness (generalized): Secondary | ICD-10-CM | POA: Diagnosis not present

## 2014-12-23 DIAGNOSIS — R262 Difficulty in walking, not elsewhere classified: Secondary | ICD-10-CM | POA: Diagnosis not present

## 2014-12-23 DIAGNOSIS — D649 Anemia, unspecified: Secondary | ICD-10-CM | POA: Diagnosis not present

## 2014-12-23 DIAGNOSIS — R001 Bradycardia, unspecified: Secondary | ICD-10-CM | POA: Diagnosis not present

## 2014-12-23 DIAGNOSIS — M549 Dorsalgia, unspecified: Secondary | ICD-10-CM | POA: Diagnosis not present

## 2014-12-23 DIAGNOSIS — Z79899 Other long term (current) drug therapy: Secondary | ICD-10-CM | POA: Diagnosis not present

## 2014-12-23 DIAGNOSIS — E784 Other hyperlipidemia: Secondary | ICD-10-CM | POA: Diagnosis not present

## 2014-12-23 DIAGNOSIS — E878 Other disorders of electrolyte and fluid balance, not elsewhere classified: Secondary | ICD-10-CM | POA: Diagnosis not present

## 2014-12-23 DIAGNOSIS — R6889 Other general symptoms and signs: Secondary | ICD-10-CM | POA: Diagnosis not present

## 2014-12-23 DIAGNOSIS — Z9181 History of falling: Secondary | ICD-10-CM | POA: Diagnosis not present

## 2014-12-24 DIAGNOSIS — G40909 Epilepsy, unspecified, not intractable, without status epilepticus: Secondary | ICD-10-CM | POA: Diagnosis not present

## 2014-12-24 DIAGNOSIS — R4182 Altered mental status, unspecified: Secondary | ICD-10-CM | POA: Diagnosis not present

## 2014-12-24 DIAGNOSIS — F419 Anxiety disorder, unspecified: Secondary | ICD-10-CM | POA: Diagnosis not present

## 2014-12-24 DIAGNOSIS — R400 Somnolence: Secondary | ICD-10-CM | POA: Diagnosis not present

## 2014-12-24 DIAGNOSIS — E871 Hypo-osmolality and hyponatremia: Secondary | ICD-10-CM | POA: Diagnosis not present

## 2014-12-24 DIAGNOSIS — M6281 Muscle weakness (generalized): Secondary | ICD-10-CM | POA: Diagnosis not present

## 2014-12-24 DIAGNOSIS — Z96653 Presence of artificial knee joint, bilateral: Secondary | ICD-10-CM | POA: Diagnosis not present

## 2014-12-24 DIAGNOSIS — Z9181 History of falling: Secondary | ICD-10-CM | POA: Diagnosis not present

## 2014-12-24 DIAGNOSIS — F0390 Unspecified dementia without behavioral disturbance: Secondary | ICD-10-CM | POA: Diagnosis not present

## 2014-12-24 DIAGNOSIS — I10 Essential (primary) hypertension: Secondary | ICD-10-CM | POA: Diagnosis not present

## 2014-12-24 DIAGNOSIS — R001 Bradycardia, unspecified: Secondary | ICD-10-CM | POA: Diagnosis not present

## 2014-12-24 DIAGNOSIS — R269 Unspecified abnormalities of gait and mobility: Secondary | ICD-10-CM | POA: Diagnosis not present

## 2014-12-24 DIAGNOSIS — R54 Age-related physical debility: Secondary | ICD-10-CM | POA: Diagnosis not present

## 2014-12-24 DIAGNOSIS — E222 Syndrome of inappropriate secretion of antidiuretic hormone: Secondary | ICD-10-CM | POA: Diagnosis not present

## 2014-12-24 DIAGNOSIS — R262 Difficulty in walking, not elsewhere classified: Secondary | ICD-10-CM | POA: Diagnosis not present

## 2014-12-24 DIAGNOSIS — G9341 Metabolic encephalopathy: Secondary | ICD-10-CM | POA: Diagnosis not present

## 2014-12-27 DIAGNOSIS — F419 Anxiety disorder, unspecified: Secondary | ICD-10-CM | POA: Diagnosis not present

## 2014-12-27 DIAGNOSIS — M6281 Muscle weakness (generalized): Secondary | ICD-10-CM | POA: Diagnosis not present

## 2014-12-27 DIAGNOSIS — E871 Hypo-osmolality and hyponatremia: Secondary | ICD-10-CM | POA: Diagnosis not present

## 2014-12-27 DIAGNOSIS — I10 Essential (primary) hypertension: Secondary | ICD-10-CM | POA: Diagnosis not present

## 2014-12-27 DIAGNOSIS — G4089 Other seizures: Secondary | ICD-10-CM | POA: Diagnosis not present

## 2014-12-27 DIAGNOSIS — R262 Difficulty in walking, not elsewhere classified: Secondary | ICD-10-CM | POA: Diagnosis not present

## 2014-12-27 DIAGNOSIS — Z79899 Other long term (current) drug therapy: Secondary | ICD-10-CM | POA: Diagnosis not present

## 2014-12-28 DIAGNOSIS — R4182 Altered mental status, unspecified: Secondary | ICD-10-CM | POA: Diagnosis not present

## 2014-12-28 DIAGNOSIS — R569 Unspecified convulsions: Secondary | ICD-10-CM | POA: Diagnosis not present

## 2014-12-28 DIAGNOSIS — I10 Essential (primary) hypertension: Secondary | ICD-10-CM | POA: Diagnosis not present

## 2014-12-30 DIAGNOSIS — E871 Hypo-osmolality and hyponatremia: Secondary | ICD-10-CM | POA: Diagnosis not present

## 2014-12-30 DIAGNOSIS — R262 Difficulty in walking, not elsewhere classified: Secondary | ICD-10-CM | POA: Diagnosis not present

## 2014-12-30 DIAGNOSIS — M6281 Muscle weakness (generalized): Secondary | ICD-10-CM | POA: Diagnosis not present

## 2014-12-31 DIAGNOSIS — E871 Hypo-osmolality and hyponatremia: Secondary | ICD-10-CM | POA: Diagnosis not present

## 2014-12-31 DIAGNOSIS — M6281 Muscle weakness (generalized): Secondary | ICD-10-CM | POA: Diagnosis not present

## 2014-12-31 DIAGNOSIS — R262 Difficulty in walking, not elsewhere classified: Secondary | ICD-10-CM | POA: Diagnosis not present

## 2015-01-01 DIAGNOSIS — R262 Difficulty in walking, not elsewhere classified: Secondary | ICD-10-CM | POA: Diagnosis not present

## 2015-01-01 DIAGNOSIS — M6281 Muscle weakness (generalized): Secondary | ICD-10-CM | POA: Diagnosis not present

## 2015-01-01 DIAGNOSIS — E871 Hypo-osmolality and hyponatremia: Secondary | ICD-10-CM | POA: Diagnosis not present

## 2015-01-06 DIAGNOSIS — M6281 Muscle weakness (generalized): Secondary | ICD-10-CM | POA: Diagnosis not present

## 2015-01-06 DIAGNOSIS — R262 Difficulty in walking, not elsewhere classified: Secondary | ICD-10-CM | POA: Diagnosis not present

## 2015-01-06 DIAGNOSIS — E785 Hyperlipidemia, unspecified: Secondary | ICD-10-CM | POA: Diagnosis not present

## 2015-01-06 DIAGNOSIS — R6889 Other general symptoms and signs: Secondary | ICD-10-CM | POA: Diagnosis not present

## 2015-01-06 DIAGNOSIS — Z79899 Other long term (current) drug therapy: Secondary | ICD-10-CM | POA: Diagnosis not present

## 2015-01-06 DIAGNOSIS — E871 Hypo-osmolality and hyponatremia: Secondary | ICD-10-CM | POA: Diagnosis not present

## 2015-01-07 DIAGNOSIS — E871 Hypo-osmolality and hyponatremia: Secondary | ICD-10-CM | POA: Diagnosis not present

## 2015-01-07 DIAGNOSIS — R262 Difficulty in walking, not elsewhere classified: Secondary | ICD-10-CM | POA: Diagnosis not present

## 2015-01-07 DIAGNOSIS — M6281 Muscle weakness (generalized): Secondary | ICD-10-CM | POA: Diagnosis not present

## 2015-01-08 DIAGNOSIS — R262 Difficulty in walking, not elsewhere classified: Secondary | ICD-10-CM | POA: Diagnosis not present

## 2015-01-08 DIAGNOSIS — M6281 Muscle weakness (generalized): Secondary | ICD-10-CM | POA: Diagnosis not present

## 2015-01-08 DIAGNOSIS — F332 Major depressive disorder, recurrent severe without psychotic features: Secondary | ICD-10-CM | POA: Diagnosis not present

## 2015-01-08 DIAGNOSIS — F419 Anxiety disorder, unspecified: Secondary | ICD-10-CM | POA: Diagnosis not present

## 2015-01-08 DIAGNOSIS — F0390 Unspecified dementia without behavioral disturbance: Secondary | ICD-10-CM | POA: Diagnosis not present

## 2015-01-08 DIAGNOSIS — R2243 Localized swelling, mass and lump, lower limb, bilateral: Secondary | ICD-10-CM | POA: Diagnosis not present

## 2015-01-08 DIAGNOSIS — E871 Hypo-osmolality and hyponatremia: Secondary | ICD-10-CM | POA: Diagnosis not present

## 2015-01-09 DIAGNOSIS — R262 Difficulty in walking, not elsewhere classified: Secondary | ICD-10-CM | POA: Diagnosis not present

## 2015-01-09 DIAGNOSIS — M6281 Muscle weakness (generalized): Secondary | ICD-10-CM | POA: Diagnosis not present

## 2015-01-09 DIAGNOSIS — E871 Hypo-osmolality and hyponatremia: Secondary | ICD-10-CM | POA: Diagnosis not present

## 2015-01-13 DIAGNOSIS — M6281 Muscle weakness (generalized): Secondary | ICD-10-CM | POA: Diagnosis not present

## 2015-01-13 DIAGNOSIS — E871 Hypo-osmolality and hyponatremia: Secondary | ICD-10-CM | POA: Diagnosis not present

## 2015-01-13 DIAGNOSIS — R262 Difficulty in walking, not elsewhere classified: Secondary | ICD-10-CM | POA: Diagnosis not present

## 2015-01-14 DIAGNOSIS — E871 Hypo-osmolality and hyponatremia: Secondary | ICD-10-CM | POA: Diagnosis not present

## 2015-01-14 DIAGNOSIS — Z79899 Other long term (current) drug therapy: Secondary | ICD-10-CM | POA: Diagnosis not present

## 2015-01-14 DIAGNOSIS — F0390 Unspecified dementia without behavioral disturbance: Secondary | ICD-10-CM | POA: Diagnosis not present

## 2015-01-14 DIAGNOSIS — R262 Difficulty in walking, not elsewhere classified: Secondary | ICD-10-CM | POA: Diagnosis not present

## 2015-01-14 DIAGNOSIS — M6281 Muscle weakness (generalized): Secondary | ICD-10-CM | POA: Diagnosis not present

## 2015-01-14 DIAGNOSIS — R6889 Other general symptoms and signs: Secondary | ICD-10-CM | POA: Diagnosis not present

## 2015-01-15 DIAGNOSIS — R262 Difficulty in walking, not elsewhere classified: Secondary | ICD-10-CM | POA: Diagnosis not present

## 2015-01-15 DIAGNOSIS — E871 Hypo-osmolality and hyponatremia: Secondary | ICD-10-CM | POA: Diagnosis not present

## 2015-01-15 DIAGNOSIS — M6281 Muscle weakness (generalized): Secondary | ICD-10-CM | POA: Diagnosis not present

## 2015-01-16 DIAGNOSIS — M6281 Muscle weakness (generalized): Secondary | ICD-10-CM | POA: Diagnosis not present

## 2015-01-16 DIAGNOSIS — R262 Difficulty in walking, not elsewhere classified: Secondary | ICD-10-CM | POA: Diagnosis not present

## 2015-01-16 DIAGNOSIS — E871 Hypo-osmolality and hyponatremia: Secondary | ICD-10-CM | POA: Diagnosis not present

## 2015-01-20 DIAGNOSIS — R262 Difficulty in walking, not elsewhere classified: Secondary | ICD-10-CM | POA: Diagnosis not present

## 2015-01-20 DIAGNOSIS — E871 Hypo-osmolality and hyponatremia: Secondary | ICD-10-CM | POA: Diagnosis not present

## 2015-01-20 DIAGNOSIS — M6281 Muscle weakness (generalized): Secondary | ICD-10-CM | POA: Diagnosis not present

## 2015-01-21 DIAGNOSIS — E871 Hypo-osmolality and hyponatremia: Secondary | ICD-10-CM | POA: Diagnosis not present

## 2015-01-21 DIAGNOSIS — R262 Difficulty in walking, not elsewhere classified: Secondary | ICD-10-CM | POA: Diagnosis not present

## 2015-01-21 DIAGNOSIS — M6281 Muscle weakness (generalized): Secondary | ICD-10-CM | POA: Diagnosis not present

## 2015-01-22 DIAGNOSIS — F0390 Unspecified dementia without behavioral disturbance: Secondary | ICD-10-CM | POA: Diagnosis not present

## 2015-01-22 DIAGNOSIS — E871 Hypo-osmolality and hyponatremia: Secondary | ICD-10-CM | POA: Diagnosis not present

## 2015-01-22 DIAGNOSIS — M6281 Muscle weakness (generalized): Secondary | ICD-10-CM | POA: Diagnosis not present

## 2015-01-22 DIAGNOSIS — R262 Difficulty in walking, not elsewhere classified: Secondary | ICD-10-CM | POA: Diagnosis not present

## 2015-01-25 IMAGING — CR DG CHEST 1V
1 series · 1 of 1 positions shown · non-contrast
Comparison: Portable chest x-ray December 01, 2013

CLINICAL DATA: Cough with history of pneumonia and hypertension

EXAM:
CHEST - 1 VIEW

[view not recorded]
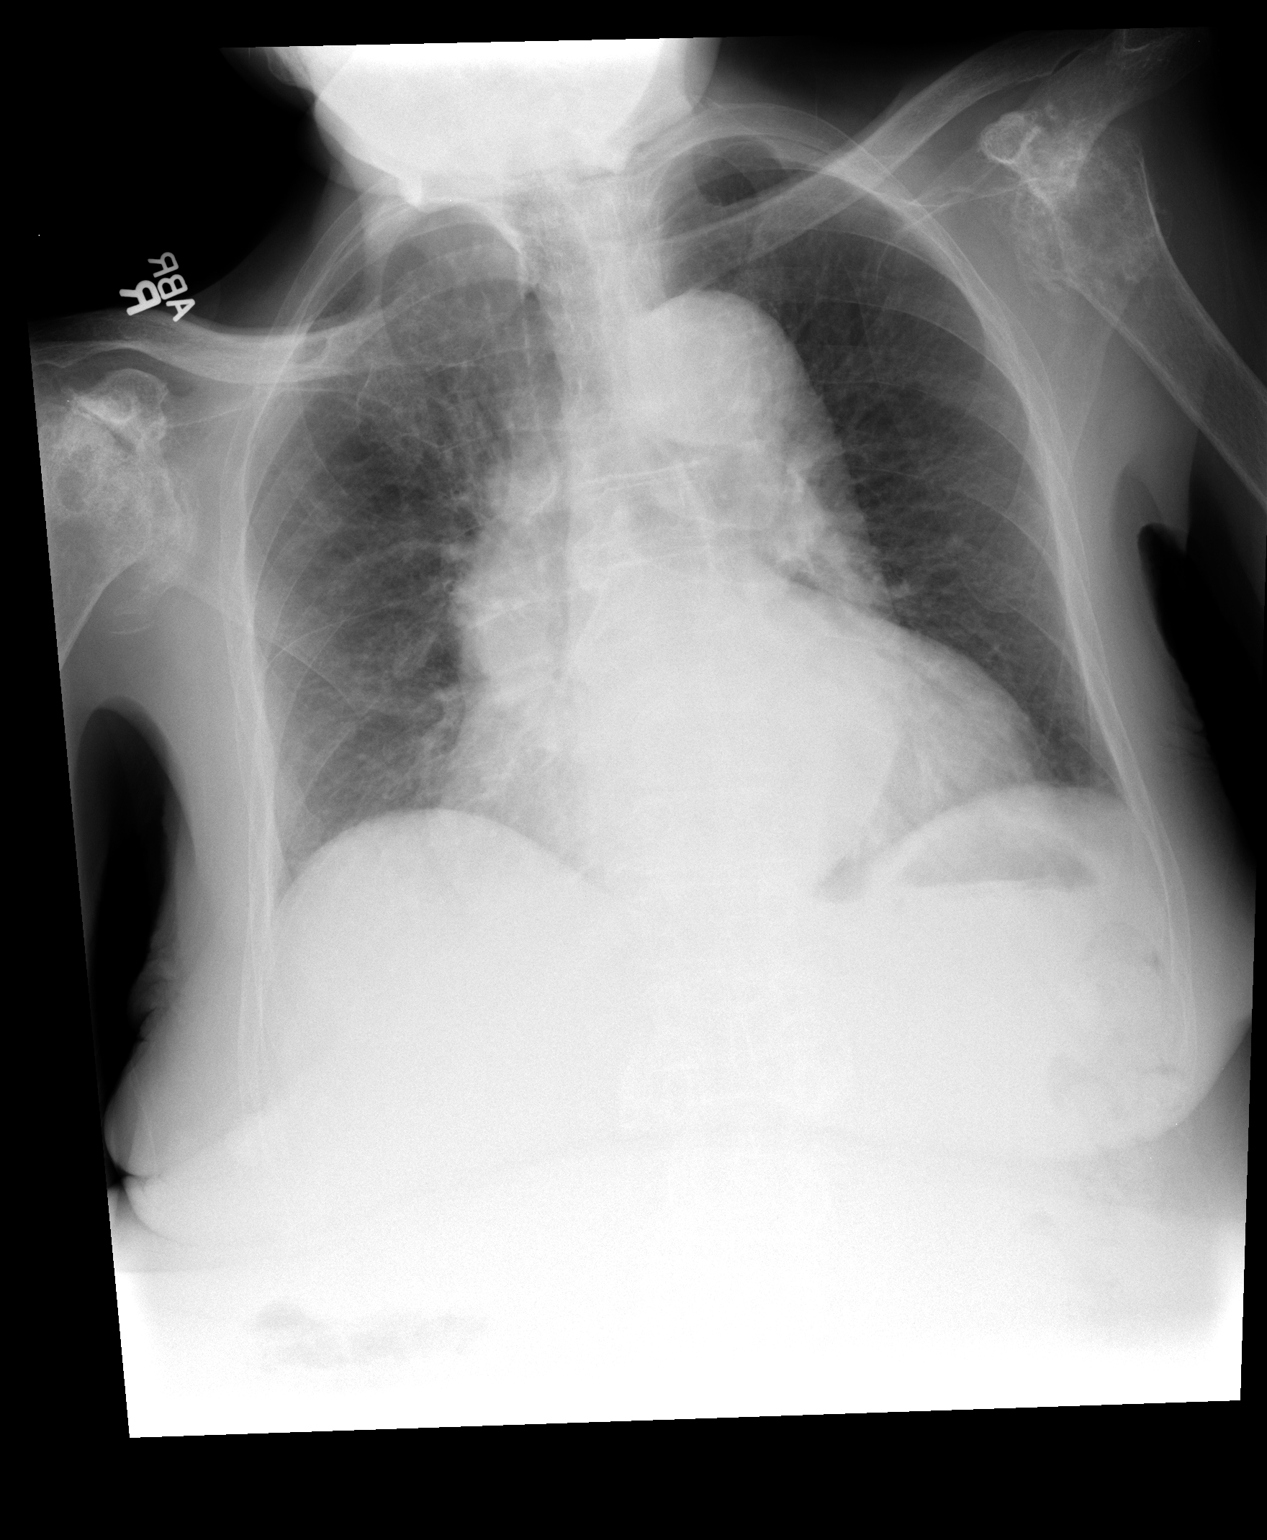

[1 of 1 positions shown; findings below may reference images not displayed]

FINDINGS: The lungs are well-expanded. There are coarse lung markings in the
right upper lobe which are not entirely new. Elsewhere the lungs are
clearer than on the previous study. No alveolar infiltrate is
demonstrated. The cardiac silhouette remains enlarged. There is
tortuosity of the ascending and descending thoracic aorta. The
pulmonary vascularity is normal. There is severe degenerative change
of both shoulders.
IMPRESSION: There are coarse lung markings in the right pulmonary apex which may
reflect residual pneumonia. Overall the lungs have markedly improved
since the previous study.

## 2015-01-27 DIAGNOSIS — R262 Difficulty in walking, not elsewhere classified: Secondary | ICD-10-CM | POA: Diagnosis not present

## 2015-01-27 DIAGNOSIS — E871 Hypo-osmolality and hyponatremia: Secondary | ICD-10-CM | POA: Diagnosis not present

## 2015-01-27 DIAGNOSIS — M6281 Muscle weakness (generalized): Secondary | ICD-10-CM | POA: Diagnosis not present

## 2015-01-28 DIAGNOSIS — E871 Hypo-osmolality and hyponatremia: Secondary | ICD-10-CM | POA: Diagnosis not present

## 2015-01-28 DIAGNOSIS — M6281 Muscle weakness (generalized): Secondary | ICD-10-CM | POA: Diagnosis not present

## 2015-01-28 DIAGNOSIS — R262 Difficulty in walking, not elsewhere classified: Secondary | ICD-10-CM | POA: Diagnosis not present

## 2015-01-28 DIAGNOSIS — F05 Delirium due to known physiological condition: Secondary | ICD-10-CM | POA: Diagnosis not present

## 2015-02-05 DIAGNOSIS — F05 Delirium due to known physiological condition: Secondary | ICD-10-CM | POA: Diagnosis not present

## 2015-02-06 DIAGNOSIS — I890 Lymphedema, not elsewhere classified: Secondary | ICD-10-CM | POA: Diagnosis not present

## 2015-02-06 DIAGNOSIS — R609 Edema, unspecified: Secondary | ICD-10-CM | POA: Diagnosis not present

## 2015-02-06 DIAGNOSIS — I509 Heart failure, unspecified: Secondary | ICD-10-CM | POA: Diagnosis not present

## 2015-02-06 DIAGNOSIS — E782 Mixed hyperlipidemia: Secondary | ICD-10-CM | POA: Diagnosis not present

## 2015-02-06 DIAGNOSIS — R0600 Dyspnea, unspecified: Secondary | ICD-10-CM | POA: Diagnosis not present

## 2015-02-06 DIAGNOSIS — I872 Venous insufficiency (chronic) (peripheral): Secondary | ICD-10-CM | POA: Diagnosis not present

## 2015-02-06 DIAGNOSIS — I119 Hypertensive heart disease without heart failure: Secondary | ICD-10-CM | POA: Diagnosis not present

## 2015-02-13 DIAGNOSIS — I361 Nonrheumatic tricuspid (valve) insufficiency: Secondary | ICD-10-CM | POA: Diagnosis not present

## 2015-02-13 DIAGNOSIS — I340 Nonrheumatic mitral (valve) insufficiency: Secondary | ICD-10-CM | POA: Diagnosis not present

## 2015-02-13 DIAGNOSIS — I371 Nonrheumatic pulmonary valve insufficiency: Secondary | ICD-10-CM | POA: Diagnosis not present

## 2015-02-24 DIAGNOSIS — I872 Venous insufficiency (chronic) (peripheral): Secondary | ICD-10-CM | POA: Diagnosis not present

## 2015-02-25 DIAGNOSIS — R2243 Localized swelling, mass and lump, lower limb, bilateral: Secondary | ICD-10-CM | POA: Diagnosis not present

## 2015-03-05 DIAGNOSIS — R2243 Localized swelling, mass and lump, lower limb, bilateral: Secondary | ICD-10-CM | POA: Diagnosis not present

## 2015-03-26 DIAGNOSIS — F333 Major depressive disorder, recurrent, severe with psychotic symptoms: Secondary | ICD-10-CM | POA: Diagnosis not present

## 2015-03-26 DIAGNOSIS — F419 Anxiety disorder, unspecified: Secondary | ICD-10-CM | POA: Diagnosis not present

## 2015-03-26 DIAGNOSIS — F0390 Unspecified dementia without behavioral disturbance: Secondary | ICD-10-CM | POA: Diagnosis not present

## 2015-03-27 DIAGNOSIS — I890 Lymphedema, not elsewhere classified: Secondary | ICD-10-CM | POA: Diagnosis not present

## 2015-04-07 DIAGNOSIS — I890 Lymphedema, not elsewhere classified: Secondary | ICD-10-CM | POA: Diagnosis not present

## 2017-02-07 DEATH — deceased
